# Patient Record
Sex: Female | Born: 1985 | Race: Black or African American | Hispanic: No | Marital: Single | Smoking: Never smoker
Health system: Southern US, Community
[De-identification: ages and names within clinical notes are randomized; demographics above are authoritative.]

## PROBLEM LIST (undated history)

## (undated) DIAGNOSIS — Z789 Other specified health status: Secondary | ICD-10-CM

## (undated) DIAGNOSIS — B009 Herpesviral infection, unspecified: Secondary | ICD-10-CM

## (undated) HISTORY — PX: OVARIAN CYST REMOVAL: SHX89

## (undated) HISTORY — DX: Other specified health status: Z78.9

---

## 2004-07-13 ENCOUNTER — Ambulatory Visit: Payer: Self-pay | Admitting: Family Medicine

## 2004-07-14 ENCOUNTER — Ambulatory Visit (HOSPITAL_COMMUNITY): Admission: RE | Admit: 2004-07-14 | Discharge: 2004-07-14 | Payer: Self-pay | Admitting: *Deleted

## 2004-07-17 ENCOUNTER — Ambulatory Visit (HOSPITAL_COMMUNITY): Admission: RE | Admit: 2004-07-17 | Discharge: 2004-07-17 | Payer: Self-pay | Admitting: *Deleted

## 2004-07-18 ENCOUNTER — Ambulatory Visit: Payer: Self-pay | Admitting: Obstetrics & Gynecology

## 2004-08-07 ENCOUNTER — Inpatient Hospital Stay (HOSPITAL_COMMUNITY): Admission: RE | Admit: 2004-08-07 | Discharge: 2004-08-09 | Payer: Self-pay | Admitting: Obstetrics & Gynecology

## 2004-08-07 ENCOUNTER — Ambulatory Visit: Payer: Self-pay | Admitting: Obstetrics & Gynecology

## 2004-08-14 ENCOUNTER — Inpatient Hospital Stay (HOSPITAL_COMMUNITY): Admission: AD | Admit: 2004-08-14 | Discharge: 2004-08-14 | Payer: Self-pay | Admitting: Obstetrics & Gynecology

## 2004-08-29 ENCOUNTER — Ambulatory Visit: Payer: Self-pay | Admitting: Obstetrics and Gynecology

## 2014-08-31 ENCOUNTER — Ambulatory Visit: Payer: Medicaid Other | Admitting: Certified Nurse Midwife

## 2014-09-09 ENCOUNTER — Emergency Department (HOSPITAL_COMMUNITY)
Admission: EM | Admit: 2014-09-09 | Discharge: 2014-09-09 | Disposition: A | Discharge status: Home / Self Care | Payer: PRIVATE HEALTH INSURANCE | Source: Home / Self Care | Attending: Family Medicine | Admitting: Family Medicine

## 2014-09-09 ENCOUNTER — Encounter (HOSPITAL_COMMUNITY): Payer: Self-pay | Admitting: Emergency Medicine

## 2014-09-09 DIAGNOSIS — S99922A Unspecified injury of left foot, initial encounter: Secondary | ICD-10-CM

## 2014-09-09 MED ORDER — HYDROCODONE-ACETAMINOPHEN 5-325 MG PO TABS: 1.0000 | ORAL_TABLET | Freq: Four times a day (QID) | ORAL | Status: DC | PRN

## 2014-09-09 NOTE — Discharge Instructions (Signed)
Ice, buddy tape toes, firm shoe and pain medicine as needed, see orthopedist if further problems

## 2014-09-09 NOTE — ED Provider Notes (Addendum)
CSN: [id number]     Arrival date & time 09/09/14  1939 History   First MD Initiated Contact with Patient 09/09/14 2011     Chief Complaint  Patient presents with  . Toe Pain   (Consider location/radiation/quality/duration/timing/severity/associated sxs/prior Treatment) Patient is a 29 y.o. female presenting with toe pain. The history is provided by the patient.  Toe Pain This is a new problem. The current episode started 12 to 24 hours ago (hit door jam of bathroom last eve with left 4th toe, swollen , sl deviation, painful.). The problem has not changed since onset.   History reviewed. No pertinent past medical history. Past Surgical History  Procedure Laterality Date  . Ovarian cyst removal     No family history on file. History  Substance Use Topics  . Smoking status: Not on file  . Smokeless tobacco: Not on file  . Alcohol Use: No   OB History    No data available     Review of Systems  Musculoskeletal: Positive for joint swelling and gait problem. Negative for myalgias and back pain.  Skin: Negative.     Allergies  Penicillins  Home Medications   Prior to Admission medications   Medication Sig Start Date End Date Taking? Authorizing Provider  HYDROcodone-acetaminophen (NORCO/VICODIN) 5-325 MG per tablet Take 1 tablet by mouth every 6 (six) hours as needed. 09/09/14   Linna HoffJames D Veronia Laprise, MD   BP 134/90 mmHg  Pulse 67  Temp(Src) 98.5 F (36.9 C) (Oral)  Resp 20  SpO2 100% Physical Exam  Constitutional: She is oriented to person, place, and time. She appears well-developed and well-nourished.  Musculoskeletal: She exhibits tenderness.       Feet:  Neurological: She is alert and oriented to person, place, and time.  Skin: Skin is warm and dry.  Nursing note and vitals reviewed.   ED Course  Procedures (including critical care time) Labs Review Labs Reviewed - No data to display  Imaging Review No results found.   MDM   1. Injury of toe on left foot,  initial encounter        Linna HoffJames D Kemba Hoppes, MD 09/09/14 2046  Linna HoffJames D Abrish Erny, MD 09/09/14 2059

## 2014-09-09 NOTE — ED Notes (Signed)
Left toe pain, stumped toe on door-facing.

## 2014-09-30 ENCOUNTER — Ambulatory Visit: Payer: Medicaid Other | Admitting: Certified Nurse Midwife

## 2014-10-15 ENCOUNTER — Ambulatory Visit (INDEPENDENT_AMBULATORY_CARE_PROVIDER_SITE_OTHER): Payer: Medicaid Other | Admitting: Certified Nurse Midwife

## 2014-10-15 ENCOUNTER — Encounter: Payer: Self-pay | Admitting: Certified Nurse Midwife

## 2014-10-15 VITALS — BP 133/92 | HR 77 | Temp 98.7°F | Ht 72.0 in | Wt 292.0 lb

## 2014-10-15 DIAGNOSIS — E669 Obesity, unspecified: Secondary | ICD-10-CM

## 2014-10-15 DIAGNOSIS — Z113 Encounter for screening for infections with a predominantly sexual mode of transmission: Secondary | ICD-10-CM

## 2014-10-15 DIAGNOSIS — Z8742 Personal history of other diseases of the female genital tract: Secondary | ICD-10-CM | POA: Insufficient documentation

## 2014-10-15 DIAGNOSIS — Z0000 Encounter for general adult medical examination without abnormal findings: Secondary | ICD-10-CM

## 2014-10-15 DIAGNOSIS — Z01419 Encounter for gynecological examination (general) (routine) without abnormal findings: Secondary | ICD-10-CM

## 2014-10-15 DIAGNOSIS — L680 Hirsutism: Secondary | ICD-10-CM

## 2014-10-15 LAB — COMPREHENSIVE METABOLIC PANEL
ALT: 11 U/L (ref 0–35)
AST: 33 U/L (ref 0–37)
Albumin: 3.6 g/dL (ref 3.5–5.2)
Alkaline Phosphatase: 58 U/L (ref 39–117)
BILIRUBIN TOTAL: 0.5 mg/dL (ref 0.2–1.2)
BUN: 9 mg/dL (ref 6–23)
CALCIUM: 8.6 mg/dL (ref 8.4–10.5)
CHLORIDE: 102 meq/L (ref 96–112)
CO2: 28 mEq/L (ref 19–32)
Creat: 0.59 mg/dL (ref 0.50–1.10)
Glucose, Bld: 67 mg/dL — ABNORMAL LOW (ref 70–99)
POTASSIUM: 4.1 meq/L (ref 3.5–5.3)
Sodium: 141 mEq/L (ref 135–145)
TOTAL PROTEIN: 7.4 g/dL (ref 6.0–8.3)

## 2014-10-15 LAB — CBC WITH DIFFERENTIAL/PLATELET
Basophils Absolute: 0 10*3/uL (ref 0.0–0.1)
Basophils Relative: 0 % (ref 0–1)
EOS ABS: 0.1 10*3/uL (ref 0.0–0.7)
EOS PCT: 1 % (ref 0–5)
HCT: 39.3 % (ref 36.0–46.0)
Hemoglobin: 13 g/dL (ref 12.0–15.0)
LYMPHS ABS: 2.6 10*3/uL (ref 0.7–4.0)
LYMPHS PCT: 20 % (ref 12–46)
MCH: 29.1 pg (ref 26.0–34.0)
MCHC: 33.1 g/dL (ref 30.0–36.0)
MCV: 88.1 fL (ref 78.0–100.0)
MONO ABS: 0.5 10*3/uL (ref 0.1–1.0)
MPV: 9.3 fL (ref 8.6–12.4)
Monocytes Relative: 4 % (ref 3–12)
Neutro Abs: 9.8 10*3/uL — ABNORMAL HIGH (ref 1.7–7.7)
Neutrophils Relative %: 75 % (ref 43–77)
Platelets: 410 10*3/uL — ABNORMAL HIGH (ref 150–400)
RBC: 4.46 MIL/uL (ref 3.87–5.11)
RDW: 15 % (ref 11.5–15.5)
WBC: 13.1 10*3/uL — ABNORMAL HIGH (ref 4.0–10.5)

## 2014-10-15 LAB — CHOLESTEROL, TOTAL: CHOLESTEROL: 158 mg/dL (ref 0–200)

## 2014-10-15 LAB — HDL CHOLESTEROL: HDL: 57 mg/dL (ref >=46)

## 2014-10-15 LAB — TRIGLYCERIDES: TRIGLYCERIDES: 47 mg/dL (ref <150)

## 2014-10-15 NOTE — Progress Notes (Signed)
Patient ID: Katelyn LeanCynthia Bender, female   DOB: 02/06/1986, 29 y.o.   MRN: [id number]    Subjective:      Katelyn Bender is a 29 y.o. female here for a routine exam.  Current complaints: none, desires to continue Mirena IUD.    Would like to have one more child.  Going to school for music teacher.    Personal health questionnaire:  Is patient Ashkenazi Jewish, have a family history of breast and/or ovarian cancer: no Is there a family history of uterine cancer diagnosed at age < 9350, gastrointestinal cancer, urinary tract cancer, family member who is a Personnel officerLynch syndrome-associated carrier: no Is the patient overweight and hypertensive, family history of diabetes, personal history of gestational diabetes, preeclampsia or PCOS: yes Is patient over 2355, have PCOS,  family history of premature CHD under age 30, diabetes, smoke, have hypertension or peripheral artery disease:  Yes, mother DM, MGM had HTN.  MGF had DM.   At any time, has a partner hit, kicked or otherwise hurt or frightened you?: no Over the past 2 weeks, have you felt down, depressed or hopeless?: no Over the past 2 weeks, have you felt little interest or pleasure in doing things?:no   Gynecologic History No LMP recorded. Patient is not currently having periods (Reason: IUD). Contraception: IUD, placed 2012,  Needs to be changed January 2017 Last Pap: unknown. Results were: abnormal pap according to the patient and has had leep procedures in the past. Last mammogram: N/A.   Obstetric History OB History  No data available  G2P1, vaginal delivery 2011  History reviewed. No pertinent past medical history.  Past Surgical History  Procedure Laterality Date  . Ovarian cyst removal     2006 Teratoma  7#, no problems since  Current outpatient prescriptions:  .  HYDROcodone-acetaminophen (NORCO/VICODIN) 5-325 MG per tablet, Take 1 tablet by mouth every 6 (six) hours as needed. (Patient not taking: Reported on 10/15/2014), Disp: 10 tablet,  Rfl: 0 Allergies  Allergen Reactions  . Penicillins     History  Substance Use Topics  . Smoking status: Never Smoker   . Smokeless tobacco: Not on file  . Alcohol Use: No    Family History  Problem Relation Age of Onset  . Diabetes Mother       Review of Systems  Constitutional: negative for fatigue and weight loss, + weight gain Respiratory: negative for cough and wheezing Cardiovascular: negative for chest pain, fatigue and palpitations Gastrointestinal: negative for abdominal pain and change in bowel habits Musculoskeletal:negative for myalgias Neurological: negative for gait problems and tremors Behavioral/Psych: negative for abusive relationship, depression Endocrine: negative for temperature intolerance   Genitourinary:negative for abnormal menstrual periods, genital lesions, hot flashes, sexual problems and vaginal discharge Integument/breast: negative for breast lump, breast tenderness, nipple discharge and skin lesion(s)    Objective:       BP 133/92 mmHg  Pulse 77  Temp(Src) 98.7 F (37.1 C)  Ht 6' (1.829 m)  Wt 292 lb (132.45 kg)  BMI 39.59 kg/m2  LMP  General:   alert  Skin:   no rash or abnormalities  Lungs:   clear to auscultation bilaterally  Heart:   regular rate and rhythm, S1, S2 normal, no murmur, click, rub or gallop  Breasts:   normal without suspicious masses, skin or nipple changes or axillary nodes  Abdomen:  normal findings: no organomegaly, soft, non-tender and no hernia obese  Pelvis:  External genitalia: normal general appearance Urinary system: urethral meatus normal  and bladder without fullness, nontender Vaginal: normal without tenderness, induration or masses Cervix: normal appearance Adnexa: normal bimanual exam, difficult to assess d/t body habitus Uterus: anteverted and non-tender, normal size   Lab Review Urine pregnancy test Labs reviewed no Radiologic studies reviewed no  50% of 30 min visit spent on counseling and  coordination of care.   Assessment:    Healthy female exam.   Continue Mirena IUD with replacement in January 2017 Hx of ovarian cyst   Plan:    Education reviewed: depression evaluation, low fat, low cholesterol diet, safe sex/STD prevention, self breast exams, skin cancer screening and weight bearing exercise. Contraception: IUD. Follow up in: 6 months.   No orders of the defined types were placed in this encounter.   Orders Placed This Encounter  Procedures  . SureSwab, Vaginosis/Vaginitis Plus  . US Transvaginal Non-OB    Standing Status: Future     Number of Occurrences:      Standing Expiration Date: 12/16/2015    Order Specific Question:  Reason for Exam (SYMPTOM  OR DIAGNOSIS REQUIRED)    Answer:  hx of ovarian tetratoma    Order Specific Question:  Preferred imaging location?    Answer:  [facility]  . HIV antibody (with reflex)  . Hepatitis B surface antigen  . RPR  . Hepatitis C antibody  . TSH  . Prolactin  . Testosterone, Free, Total, SHBG  . 17-Hydroxyprogesterone  . Progesterone  . CBC with Differential/Platelet  . Comprehensive metabolic panel  . Cholesterol, total  . Triglycerides  . HDL cholesterol  . Hemoglobin A1c  . Referral to Nutrition and Diabetes Services    Referral Priority:  Routine    Referral Type:  Consultation    Referral Reason:  Specialty Services Required    Number of Visits Requested:  1

## 2014-10-16 LAB — RPR

## 2014-10-16 LAB — PROLACTIN: Prolactin: 20.7 ng/mL

## 2014-10-16 LAB — HIV ANTIBODY (ROUTINE TESTING W REFLEX): HIV 1&2 Ab, 4th Generation: NONREACTIVE

## 2014-10-16 LAB — PROGESTERONE: PROGESTERONE: 0.2 ng/mL

## 2014-10-16 LAB — HEMOGLOBIN A1C
Hgb A1c MFr Bld: 5.3 % (ref <5.7)
MEAN PLASMA GLUCOSE: 105 mg/dL (ref <117)

## 2014-10-16 LAB — HEPATITIS B SURFACE ANTIGEN: HEP B S AG: NEGATIVE

## 2014-10-16 LAB — TSH: TSH: 2.94 u[IU]/mL (ref 0.350–4.500)

## 2014-10-16 LAB — HEPATITIS C ANTIBODY: HCV AB: NEGATIVE

## 2014-10-19 LAB — TESTOSTERONE, FREE, TOTAL, SHBG
SEX HORMONE BINDING: 47 nmol/L (ref 17–124)
TESTOSTERONE FREE: 6.2 pg/mL (ref 0.6–6.8)
TESTOSTERONE: 43 ng/dL (ref 10–70)
Testosterone-% Free: 1.4 % (ref 0.4–2.4)

## 2014-10-19 LAB — 17-HYDROXYPROGESTERONE: 17-OH-PROGESTERONE, LC/MS/MS: 15 ng/dL

## 2014-10-20 LAB — PAP IG AND HPV HIGH-RISK: HPV DNA HIGH RISK: DETECTED — AB

## 2014-10-21 ENCOUNTER — Ambulatory Visit (HOSPITAL_COMMUNITY)
Admission: RE | Admit: 2014-10-21 | Discharge: 2014-10-21 | Disposition: A | Discharge status: Home / Self Care | Payer: BLUE CROSS/BLUE SHIELD | Source: Ambulatory Visit | Attending: Certified Nurse Midwife | Admitting: Certified Nurse Midwife

## 2014-10-21 ENCOUNTER — Other Ambulatory Visit: Payer: Self-pay | Admitting: Certified Nurse Midwife

## 2014-10-21 DIAGNOSIS — L680 Hirsutism: Secondary | ICD-10-CM

## 2014-10-21 DIAGNOSIS — Z975 Presence of (intrauterine) contraceptive device: Secondary | ICD-10-CM | POA: Insufficient documentation

## 2014-10-21 DIAGNOSIS — B9689 Other specified bacterial agents as the cause of diseases classified elsewhere: Secondary | ICD-10-CM

## 2014-10-21 DIAGNOSIS — Z8742 Personal history of other diseases of the female genital tract: Secondary | ICD-10-CM

## 2014-10-21 DIAGNOSIS — N8320 Unspecified ovarian cysts: Secondary | ICD-10-CM | POA: Insufficient documentation

## 2014-10-21 DIAGNOSIS — N760 Acute vaginitis: Principal | ICD-10-CM

## 2014-10-21 DIAGNOSIS — N83202 Unspecified ovarian cyst, left side: Secondary | ICD-10-CM

## 2014-10-21 LAB — SURESWAB, VAGINOSIS/VAGINITIS PLUS
Atopobium vaginae: 6.3 Log (cells/mL)
C. GLABRATA, DNA: NOT DETECTED
C. albicans, DNA: NOT DETECTED
C. parapsilosis, DNA: NOT DETECTED
C. trachomatis RNA, TMA: NOT DETECTED
C. tropicalis, DNA: NOT DETECTED
GARDNERELLA VAGINALIS: 7.5 Log (cells/mL)
LACTOBACILLUS SPECIES: NOT DETECTED Log (cells/mL)
MEGASPHAERA SPECIES: 7 Log (cells/mL)
N. GONORRHOEAE RNA, TMA: NOT DETECTED
T. vaginalis RNA, QL TMA: NOT DETECTED

## 2014-10-21 MED ORDER — TINIDAZOLE 500 MG PO TABS: 2.0000 g | ORAL_TABLET | Freq: Every day | ORAL | Status: AC

## 2014-11-09 ENCOUNTER — Encounter: Payer: Self-pay | Admitting: Obstetrics

## 2014-11-09 ENCOUNTER — Ambulatory Visit (INDEPENDENT_AMBULATORY_CARE_PROVIDER_SITE_OTHER): Payer: PRIVATE HEALTH INSURANCE | Admitting: Obstetrics

## 2014-11-09 ENCOUNTER — Other Ambulatory Visit: Payer: Self-pay | Admitting: Obstetrics

## 2014-11-09 VITALS — BP 120/84 | HR 72 | Temp 98.3°F | Ht 72.0 in | Wt 290.0 lb

## 2014-11-09 DIAGNOSIS — IMO0002 Reserved for concepts with insufficient information to code with codable children: Secondary | ICD-10-CM

## 2014-11-09 DIAGNOSIS — R896 Abnormal cytological findings in specimens from other organs, systems and tissues: Secondary | ICD-10-CM | POA: Diagnosis not present

## 2014-11-09 DIAGNOSIS — Z01812 Encounter for preprocedural laboratory examination: Secondary | ICD-10-CM

## 2014-11-09 LAB — POCT URINE PREGNANCY: PREG TEST UR: NEGATIVE

## 2014-11-09 NOTE — Progress Notes (Signed)
Colposcopy Procedure Note  Indications: Pap smear 1 months ago showed: ASCUS with POSITIVE high risk HPV. The prior pap showed ASCUS with POSITIVE high risk HPV.  Prior cervical/vaginal disease: normal exam without visible pathology. Prior cervical treatment: no treatment.  Procedure Details  The risks and benefits of the procedure and Written informed consent obtained.  A time-out was performed confirming the patient, procedure and allergy status  Speculum placed in vagina and excellent visualization of cervix achieved, cervix swabbed x 3 with acetic acid solution.  Findings: Cervix: no visible lesions, no mosaicism, no punctation and no abnormal vasculature; SCJ visualized 360 degrees without lesions, endocervical curettage performed, cervical biopsies taken at 7 and 10 o'clock, specimen labelled and sent to pathology and hemostasis achieved with silver nitrate.   Vaginal inspection: normal without visible lesions. Vulvar colposcopy: vulvar colposcopy not performed.   Physical Exam   Specimens: ECC and cervical biopsies  Complications: none.  Plan: Specimens labelled and sent to Pathology. Will base further treatment on Pathology findings. Treatment options discussed with patient. Post biopsy instructions given to patient. Return to discuss Pathology results in 2 weeks.

## 2014-11-09 NOTE — Patient Instructions (Signed)
Abnormal Pap Test Information During a Pap test, the cells on the surface of your cervix are checked to see if they look normal, abnormal, or if they show signs of having been altered by a certain type of virus called human papillomavirus, or HPV. Cervical cells that have been affected by HPV are called dysplasia. Dysplasia is not cancer, but describes abnormal cells found on the surface of the cervix. Depending on the degree of dysplasia, some of the cells may be considered pre-cancerous and may turn into cancer over time if follow up with a caregiver is delayed.  WHAT DOES AN ABNORMAL PAP TEST MEAN? Having an abnormal pap test does not mean that you have cancer. However, certain types of abnormal pap tests can be a sign that a person is at a higher risk of developing cancer. Your caregiver will want to do other tests to find out more about the abnormal cells. Your abnormal Pap test results could show:   Small and uncertain changes that should be carefully watched.   Cervical dysplasia that has caused mild changes and can be followed over time.  Cervical dysplasia that is more severe and needs to be followed and treated to ensure the problem goes away.  Cancer.  When severe cervical dysplasia is found and treated early, it rarely will grow into cancer.  WHAT WILL BE DONE ABOUT MY ABNORMAL PAP TEST?  A colposcopy may be needed. This is a procedure where your cervix is examined using light and magnification.  A small tissue sample of your cervix (biopsy) may need to be removed and then examined. This is often performed if there are areas that appear infected.  A sample of cells from the cervical canal may be removed with either a small brush or scraping instrument (curette). Based on the results of the procedures above, some caregivers may recommend either cryotherapy of the cervix or a surgical LEEP where a portion of the cervix is removed. LEEP is short for "loop electrical excisional  procedure." Rarely, a caregiver may recommend a cone biopsy.This is a procedure where a small, cone-shaped sample of your cervix is taken out. The part that is taken out is the area where the abnormal cells are.  WHAT IF I HAVE A DYSPLASIA OR A CANCER? You may be referred to a specialist. Radiation may also be a treatment for more advanced cancer. Having a hysterectomy is the last treatment option for dysplasia, but it is a more common treatment for someone with cancer. All treatment options will be discussed with you by your caregiver. WHAT SHOULD YOU DO AFTER BEING TREATED? If you have had an abnormal pap test, you should continue to have regular pap tests and check-ups as directed by your caregiver. Your cervical problem will be carefully watched so it does not get worse. Also, your caregiver can watch for, and treat, any new problems that may come up. Document Released: 07/18/2010 Document Revised: 07/28/2012 Document Reviewed: 03/29/2011 ExitCare Patient Information 2015 [employer]. This information is not intended to replace advice given to you by your health care provider. Make sure you discuss any questions you have with your health care provider.  

## 2014-11-09 NOTE — Addendum Note (Signed)
Addended by: Marya Landry D on: 11/09/2014 01:34 PM   Modules accepted: Orders

## 2014-11-11 ENCOUNTER — Ambulatory Visit: Payer: Medicaid Other | Admitting: Dietician

## 2014-11-11 LAB — SURESWAB, VAGINOSIS/VAGINITIS PLUS
ATOPOBIUM VAGINAE: NOT DETECTED Log (cells/mL)
C. PARAPSILOSIS, DNA: NOT DETECTED
C. TROPICALIS, DNA: NOT DETECTED
C. albicans, DNA: NOT DETECTED
C. glabrata, DNA: NOT DETECTED
C. trachomatis RNA, TMA: NOT DETECTED
Gardnerella vaginalis: 6.9 Log (cells/mL)
LACTOBACILLUS SPECIES: NOT DETECTED Log (cells/mL)
MEGASPHAERA SPECIES: 6.1 Log (cells/mL)
N. GONORRHOEAE RNA, TMA: NOT DETECTED
T. vaginalis RNA, QL TMA: NOT DETECTED

## 2014-11-12 ENCOUNTER — Other Ambulatory Visit: Payer: Self-pay | Admitting: Obstetrics

## 2014-11-12 DIAGNOSIS — B9689 Other specified bacterial agents as the cause of diseases classified elsewhere: Secondary | ICD-10-CM

## 2014-11-12 DIAGNOSIS — N760 Acute vaginitis: Principal | ICD-10-CM

## 2014-11-12 MED ORDER — TINIDAZOLE 500 MG PO TABS: 1000.0000 mg | ORAL_TABLET | Freq: Every day | ORAL | Status: DC

## 2014-11-23 ENCOUNTER — Encounter: Payer: Self-pay | Admitting: Obstetrics

## 2014-11-23 ENCOUNTER — Ambulatory Visit (INDEPENDENT_AMBULATORY_CARE_PROVIDER_SITE_OTHER): Payer: Medicaid Other | Admitting: Obstetrics

## 2014-11-23 VITALS — BP 121/85 | HR 75 | Temp 98.6°F | Ht 72.0 in | Wt 289.0 lb

## 2014-11-23 DIAGNOSIS — R896 Abnormal cytological findings in specimens from other organs, systems and tissues: Secondary | ICD-10-CM

## 2014-11-23 DIAGNOSIS — A499 Bacterial infection, unspecified: Secondary | ICD-10-CM | POA: Diagnosis not present

## 2014-11-23 DIAGNOSIS — N760 Acute vaginitis: Secondary | ICD-10-CM | POA: Diagnosis not present

## 2014-11-23 DIAGNOSIS — B9689 Other specified bacterial agents as the cause of diseases classified elsewhere: Secondary | ICD-10-CM

## 2014-11-23 DIAGNOSIS — IMO0002 Reserved for concepts with insufficient information to code with codable children: Secondary | ICD-10-CM

## 2014-11-23 NOTE — Progress Notes (Signed)
Patient ID: Katelyn Ghattasfemale   DOB: July 26, 1985, 28 y.o.   MRN: [id number]  Chief Complaint  Patient presents with  . Follow-up    HPI Katelyn Bender is a 29 y.o. female.  Presents for consultation for abnormal pap smear and results of colposcopic directed biopsies.  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 HPI  History reviewed. No pertinent past medical history.  Past Surgical History  Procedure Laterality Date  . Ovarian cyst removal      Family History  Problem Relation Age of Onset  . Diabetes Mother     Social History History  Substance Use Topics  . Smoking status: Never Smoker   . Smokeless tobacco: Not on file  . Alcohol Use: No    Allergies  Allergen Reactions  . Penicillins     Current Outpatient Prescriptions  Medication Sig Dispense Refill  . tinidazole (TINDAMAX) 500 MG tablet Take 2 tablets (1,000 mg total) by mouth daily with breakfast. (Patient not taking: Reported on 11/23/2014) 10 tablet 2   No current facility-administered medications for this visit.    Review of Systems Review of Systems Constitutional: negative for fatigue and weight loss Respiratory: negative for cough and wheezing Cardiovascular: negative for chest pain, fatigue and palpitations Gastrointestinal: negative for abdominal pain and change in bowel habits Genitourinary:negative Integument/breast: negative for nipple discharge Musculoskeletal:negative for myalgias Neurological: negative for gait problems and tremors Behavioral/Psych: negative for abusive relationship, depression Endocrine: negative for temperature intolerance     Blood pressure 121/85, pulse 75, temperature 98.6 F (37 C), height 6' (1.829 m), weight 289 lb (131.09 kg).  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   No orders of the defined types were placed in this encounter.

## 2014-12-03 ENCOUNTER — Encounter: Payer: Self-pay | Admitting: *Deleted

## 2014-12-13 ENCOUNTER — Ambulatory Visit: Payer: Medicaid Other | Admitting: Dietician

## 2015-01-17 ENCOUNTER — Encounter: Payer: Medicaid Other | Attending: Certified Nurse Midwife | Admitting: *Deleted

## 2015-01-17 ENCOUNTER — Encounter: Payer: Self-pay | Admitting: *Deleted

## 2015-01-17 DIAGNOSIS — Z713 Dietary counseling and surveillance: Secondary | ICD-10-CM | POA: Insufficient documentation

## 2015-01-17 DIAGNOSIS — E669 Obesity, unspecified: Secondary | ICD-10-CM | POA: Insufficient documentation

## 2015-01-17 DIAGNOSIS — Z6839 Body mass index (BMI) 39.0-39.9, adult: Secondary | ICD-10-CM | POA: Insufficient documentation

## 2015-01-17 NOTE — Progress Notes (Signed)
  Medical Nutrition Therapy:  Appt start time: 0800 end time:  0900.  Assessment:  Primary concerns today: Katelyn Bender is here upon referral for obesity.  She states she has tried to lose weight in the past, but her modification have not been sustainable.  She is full tim in school and she thinks that has affected her lifestyle habits.  Her mom has diabetes and Katelyn Bender is trying to avoid that.  She presents with hirsutism.  She has history of ovarian cyst.  Denies irregular periods , but has IUD placed.  Lab work in July 2016 was normal, but again, IUD was in place.  PCOS could be possibility, but not able to determine with IUD. Attempted weight loss: detox tea, Herbalife, calorie restriction and none of those were helpful.   She states she has low energy and is tired all the time.  Does to sleep around 11 and wakes up around 6. She wakes up tired and will take a nap in the mornings after her son goes to school  States she is the heaviest of all her siblings.  She started gaining weight excessively around menses (11 years).   Heaviest weight outside of pregnancy is current: 293 lb Lowest weight: 245 lb Most consistent weight: 280 lb Would like to weigh: 200 lb  Katelyn Bender does grocery shopping for her household and the cooking.  She likes to fry foods and eats out often.  When at home she eats in the living room and her bedroom. She watches tv while eating and is not a fast eater.    Preferred Learning Style:   No preference indicated   Learning Readiness:  Ready  MEDICATIONS: none   DIETARY INTAKE:  Usual eating pattern includes 1-2 meals and 2-3 snacks per day.  Everyday foods include energy-dense processed/convenience foods.  Avoided foods include none.    24-hr recall:  B ( AM):  Scrambled eggs, bacon, 2 slices toast  Snk ( AM): sugary cereal  Late L ( PM): McDonald's double cheeseburger and fries  Snk ( PM): cookie D ( PM): none Snk ( PM): none Beverages: Hi-C, OJ  Usual physical  activity: ADLs  Estimated energy needs: 2000 calories   Nutritional Diagnosis:  NB-1.7 Undesireable food choices As related to excessive convenience food consumption.  As evidenced by dietary recall.    Intervention:  Nutrition counseling provided.  Discussed metabolic effects of meal skipping and discouraged this practice.  Recommended 3 meals/day.  Discussed MyPlate recommendations for meal planning, focusing on whole grains,lean proteins, calcium-rich dairy (lactose-free options discussed), and increased fruits/vegetables.  Discussed healthier options when eating out and making healthier beverage choices.  Encouraged Katelyn Bender to get small, attainable goals, and then move forward as she meets those goals  Teaching Method Utilized:  Visual Auditory   Handouts given during visit include:  MyPlate  Healthier fast food options  Breakfast ideas  Bake, broil, grill  Barriers to learning/adherence to lifestyle change: planning ahead with meals  Demonstrated degree of understanding via:  Teach Back   Monitoring/Evaluation:  Dietary intake, exercise,  and body weight in 6 week(s).

## 2015-01-17 NOTE — Patient Instructions (Signed)
Aim for 3 meals each day: follow MyPlate recommendations Aim to drink more water and less sugary drinks Choose healthier options when eating out: chicken vs beef, grilled vs fried.  Try not to supersize

## 2015-02-28 ENCOUNTER — Encounter: Payer: Self-pay | Admitting: *Deleted

## 2015-02-28 ENCOUNTER — Encounter: Payer: Medicaid Other | Attending: Certified Nurse Midwife | Admitting: *Deleted

## 2015-02-28 DIAGNOSIS — Z6839 Body mass index (BMI) 39.0-39.9, adult: Secondary | ICD-10-CM | POA: Diagnosis not present

## 2015-02-28 DIAGNOSIS — Z713 Dietary counseling and surveillance: Secondary | ICD-10-CM | POA: Insufficient documentation

## 2015-02-28 DIAGNOSIS — E669 Obesity, unspecified: Secondary | ICD-10-CM

## 2015-02-28 NOTE — Progress Notes (Signed)
  Medical Nutrition Therapy:  Appt start time: 0800 end time:  0830.  Assessment:  Primary concerns today: Katelyn Bender is here for follow up nutrition cou States she has been trying to eat 3 meals/day.  She gets something easy for breakfast in the morning like PB and J or egg white sandwich from [employer].  Also started physical activity.  She states she has been losing weight and she is really pleased.  States her energy is a little better.  She stays busy and that wears her out.     Preferred Learning Style:   No preference indicated   Learning Readiness:  Change in progress  MEDICATIONS: none   DIETARY INTAKE:  Usual eating pattern includes 3 meals and 0-1 snacks per day.  Everyday foods include energy-dense processed/convenience foods.  Avoided foods include none.    24-hr recall:  B: none L: buffet (but only had 1 plate): BBQ, coleslaw, fried chicken breast, shrimp,creamed potatoes, some [place] Beverages: sweet tea and water   Typical day B: PB and J or [employer] sandwich with water or OJ L: mcdonald's (happy meal with apple slices, apple juice, little fries) D: tacos with salad or another happy meal Beverages: water throughout the day   Usual physical activity: treadmill 25 minutes and some weight machines 2 days/week.  Estimated energy needs: 2000 calories   Nutritional Diagnosis:  NB-1.7 Undesireable food choices As related to excessive convenience food consumption.  As evidenced by dietary recall.    Intervention:  Nutrition counseling provided.  Praised Katelyn Bender for her great progress.  Reiterated setting small, attainable goals, and then setting news goals once those are reached.  Her goal this time is to exercise 3 days/week and to increase vegetables by eating more salads when she eats out   Teaching Method Utilized:  Auditory    Barriers to learning/adherence to lifestyle change: planning ahead with meals  Demonstrated degree of understanding via:   Teach Back   Monitoring/Evaluation:  Dietary intake, exercise,  and body weight prn.

## 2015-05-23 ENCOUNTER — Encounter: Payer: Self-pay | Admitting: Obstetrics

## 2015-05-23 ENCOUNTER — Other Ambulatory Visit: Payer: Self-pay | Admitting: Obstetrics

## 2015-05-23 ENCOUNTER — Ambulatory Visit (INDEPENDENT_AMBULATORY_CARE_PROVIDER_SITE_OTHER): Payer: Medicaid Other | Admitting: Obstetrics

## 2015-05-23 VITALS — BP 120/84 | HR 72 | Temp 98.3°F

## 2015-05-23 DIAGNOSIS — R896 Abnormal cytological findings in specimens from other organs, systems and tissues: Secondary | ICD-10-CM

## 2015-05-23 DIAGNOSIS — Z8742 Personal history of other diseases of the female genital tract: Secondary | ICD-10-CM

## 2015-05-23 DIAGNOSIS — IMO0002 Reserved for concepts with insufficient information to code with codable children: Secondary | ICD-10-CM

## 2015-05-23 NOTE — Progress Notes (Signed)
[name], female   DOB: 1985-12-19, 30 y.o.   MRN: [id number]  Chief Complaint  Patient presents with  . Follow-up    Repeat pap smear    HPI [name] is a 30 y.o. female.  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  Presents for repeat pap.  HPI  History reviewed. No pertinent past medical history.  Past Surgical History  Procedure Laterality Date  . Ovarian cyst removal      Family History  Problem Relation Age of Onset  . Diabetes Mother     Social History Social History  Substance Use Topics  . Smoking status: Never Smoker   . Smokeless tobacco: None  . Alcohol Use: No    Allergies  Allergen Reactions  . Penicillins     No current outpatient prescriptions on file.   No current facility-administered medications for this visit.    Review of Systems Review of Systems Constitutional: negative for fatigue and weight loss Respiratory: negative for cough and wheezing Cardiovascular: negative for chest pain, fatigue and palpitations Gastrointestinal: negative for abdominal pain and change in bowel habits Genitourinary:negative Integument/breast: negative for nipple discharge Musculoskeletal:negative for myalgias Neurological: negative for gait problems and tremors Behavioral/Psych: negative for abusive relationship, depression Endocrine: negative for temperature intolerance     Blood pressure 120/84, pulse 72, temperature 98.3 F (36.8 C).  Physical Exam Physical Exam           Alert and no distress Abdomen:  normal findings: no organomegaly, soft, non-tender and no hernia  Pelvis:  External genitalia: normal general appearance Urinary system: urethral meatus normal and bladder without fullness, nontender Vaginal: normal without tenderness, induration or masses Cervix: normal appearance Adnexa: normal bimanual exam Uterus: anteverted and non-tender, normal size      Data  Reviewed Pap Colposcopic Biopsies  Assessment     LGSIL     Plan    Repeat pap 1 year  Orders Placed This Encounter  Procedures  . SureSwab, Vaginosis/Vaginitis Plus   No orders of the defined types were placed in this encounter.

## 2015-05-23 NOTE — Patient Instructions (Signed)

## 2015-05-25 LAB — PAP IG AND HPV HIGH-RISK: HPV DNA HIGH RISK: DETECTED — AB

## 2015-05-26 ENCOUNTER — Other Ambulatory Visit: Payer: Self-pay | Admitting: Obstetrics

## 2015-05-26 DIAGNOSIS — B9689 Other specified bacterial agents as the cause of diseases classified elsewhere: Secondary | ICD-10-CM

## 2015-05-26 DIAGNOSIS — A5901 Trichomonal vulvovaginitis: Secondary | ICD-10-CM

## 2015-05-26 DIAGNOSIS — N760 Acute vaginitis: Principal | ICD-10-CM

## 2015-05-26 DIAGNOSIS — B3731 Acute candidiasis of vulva and vagina: Secondary | ICD-10-CM

## 2015-05-26 DIAGNOSIS — B373 Candidiasis of vulva and vagina: Secondary | ICD-10-CM

## 2015-05-26 LAB — SURESWAB, VAGINOSIS/VAGINITIS PLUS
Atopobium vaginae: 7 Log (cells/mL)
C. albicans, DNA: DETECTED — AB
C. glabrata, DNA: NOT DETECTED
C. parapsilosis, DNA: NOT DETECTED
C. trachomatis RNA, TMA: NOT DETECTED
C. tropicalis, DNA: NOT DETECTED
GARDNERELLA VAGINALIS: 7.3 Log (cells/mL)
LACTOBACILLUS SPECIES: NOT DETECTED Log (cells/mL)
N. GONORRHOEAE RNA, TMA: NOT DETECTED
T. vaginalis RNA, QL TMA: DETECTED — AB

## 2015-05-26 LAB — HPV TYPE 16/18
HPV GENOTYPE, 16: NOT DETECTED
HPV GENOTYPE, 18: NOT DETECTED

## 2015-05-26 MED ORDER — FLUCONAZOLE 150 MG PO TABS: 150.0000 mg | ORAL_TABLET | Freq: Once | ORAL | Status: DC

## 2015-05-26 MED ORDER — TINIDAZOLE 500 MG PO TABS: 2.0000 g | ORAL_TABLET | Freq: Every day | ORAL | Status: DC

## 2015-06-10 ENCOUNTER — Telehealth: Payer: Self-pay | Admitting: *Deleted

## 2015-06-10 NOTE — Telephone Encounter (Signed)
Patient is interested in an IUD removal.  Attempted to contact the patient and left message for patient to call the office.  

## 2015-06-15 ENCOUNTER — Ambulatory Visit: Payer: Self-pay | Admitting: Obstetrics

## 2015-06-21 ENCOUNTER — Encounter: Payer: Self-pay | Admitting: Obstetrics

## 2015-06-21 ENCOUNTER — Ambulatory Visit (INDEPENDENT_AMBULATORY_CARE_PROVIDER_SITE_OTHER): Payer: Medicaid Other | Admitting: Obstetrics

## 2015-06-21 VITALS — BP 116/83 | HR 83 | Temp 98.2°F | Wt 286.0 lb

## 2015-06-21 DIAGNOSIS — Z3009 Encounter for other general counseling and advice on contraception: Secondary | ICD-10-CM

## 2015-06-21 DIAGNOSIS — Z30432 Encounter for removal of intrauterine contraceptive device: Secondary | ICD-10-CM | POA: Diagnosis not present

## 2015-06-21 DIAGNOSIS — Z30011 Encounter for initial prescription of contraceptive pills: Secondary | ICD-10-CM

## 2015-06-21 MED ORDER — NORETHIN ACE-ETH ESTRAD-FE 1-20 MG-MCG(24) PO TABS: 1.0000 | ORAL_TABLET | Freq: Every day | ORAL | Status: DC

## 2015-06-21 NOTE — Progress Notes (Signed)
Subjective:    Katelyn Bender is a [age] y.o. female who presents for removal of IUD because of expiration. The patient has no complaints today. The patient is sexually active. Pertinent past medical history: none.  The information documented in the HPI was reviewed and verified.  Menstrual History: OB History    No data available      No LMP recorded. Patient is not currently having periods (Reason: IUD).   Patient Active Problem List   Diagnosis Date Noted  . Hx of ovarian cyst 10/15/2014   History reviewed. No pertinent past medical history.  Past Surgical History  Procedure Laterality Date  . Ovarian cyst removal       Current outpatient prescriptions:  .  Norethindrone Acetate-Ethinyl Estrad-FE (LOESTRIN 24 FE) 1-20 MG-MCG(24) tablet, Take 1 tablet by mouth daily., Disp: 1 Package, Rfl: 11 Allergies  Allergen Reactions  . Penicillins     Social History  Substance Use Topics  . Smoking status: Never Smoker   . Smokeless tobacco: Never Used  . Alcohol Use: No    Family History  Problem Relation Age of Onset  . Diabetes Mother        Review of Systems Constitutional: negative for weight loss Genitourinary:negative for abnormal menstrual periods and vaginal discharge   Objective:   BP 116/83 mmHg  Pulse 83  Temp(Src) 98.2 F (36.8 C)  Wt 286 lb (129.729 kg)           General:  Alert and no distress Abdomen:  normal findings: no organomegaly, soft, non-tender and no hernia  Pelvis:  External genitalia: normal general appearance Urinary system: urethral meatus normal and bladder without fullness, nontender Vaginal: normal without tenderness, induration or masses Cervix: normal appearance Adnexa: normal bimanual exam Uterus: anteverted and non-tender, normal size   Lab Review Urine pregnancy test Labs reviewed yes Radiologic studies reviewed no    Assessment:    30 y.o., discontinuing IUD, no contraindications.  Removal with expiration.  Wants to  switch to OCP's  Plan:    Loestrin 24 Fe Rx  All questions answered. Chlamydia specimen. Contraception: OCP (estrogen/progesterone). Discussed healthy lifestyle modifications. Agricultural engineerducational material distributed. GC specimen. KOH prep. Wet prep.    Meds ordered this encounter  Medications  . Norethindrone Acetate-Ethinyl Estrad-FE (LOESTRIN 24 FE) 1-20 MG-MCG(24) tablet    Sig: Take 1 tablet by mouth daily.    Dispense:  1 Package    Refill:  11   Orders Placed This Encounter  Procedures  . SureSwab, Vaginosis/Vaginitis Plus

## 2015-06-24 LAB — SURESWAB, VAGINOSIS/VAGINITIS PLUS
Atopobium vaginae: 6.7 Log (cells/mL)
C. PARAPSILOSIS, DNA: NOT DETECTED
C. TROPICALIS, DNA: NOT DETECTED
C. albicans, DNA: NOT DETECTED
C. glabrata, DNA: NOT DETECTED
C. trachomatis RNA, TMA: NOT DETECTED
GARDNERELLA VAGINALIS: 7.9 Log (cells/mL)
LACTOBACILLUS SPECIES: NOT DETECTED Log (cells/mL)
MEGASPHAERA SPECIES: 7.9 Log (cells/mL)
N. GONORRHOEAE RNA, TMA: NOT DETECTED
T. VAGINALIS RNA, QL TMA: NOT DETECTED

## 2015-06-25 ENCOUNTER — Other Ambulatory Visit: Payer: Self-pay | Admitting: Obstetrics

## 2015-06-25 DIAGNOSIS — B9689 Other specified bacterial agents as the cause of diseases classified elsewhere: Secondary | ICD-10-CM

## 2015-06-25 DIAGNOSIS — N760 Acute vaginitis: Principal | ICD-10-CM

## 2015-06-25 MED ORDER — METRONIDAZOLE 500 MG PO TABS: 500.0000 mg | ORAL_TABLET | Freq: Two times a day (BID) | ORAL | Status: DC

## 2015-07-08 NOTE — Telephone Encounter (Signed)
Patient has had IUD removed since phone call note.

## 2016-07-11 ENCOUNTER — Encounter: Payer: Self-pay | Admitting: *Deleted

## 2016-07-11 ENCOUNTER — Ambulatory Visit (INDEPENDENT_AMBULATORY_CARE_PROVIDER_SITE_OTHER): Payer: Medicaid Other | Admitting: Obstetrics

## 2016-07-11 ENCOUNTER — Other Ambulatory Visit (HOSPITAL_COMMUNITY)
Admission: RE | Admit: 2016-07-11 | Discharge: 2016-07-11 | Disposition: A | Discharge status: Home / Self Care | Payer: Medicaid Other | Source: Ambulatory Visit | Attending: Obstetrics | Admitting: Obstetrics

## 2016-07-11 ENCOUNTER — Encounter: Payer: Self-pay | Admitting: Obstetrics

## 2016-07-11 VITALS — BP 120/76 | HR 76 | Ht 72.0 in | Wt 263.0 lb

## 2016-07-11 DIAGNOSIS — Z113 Encounter for screening for infections with a predominantly sexual mode of transmission: Secondary | ICD-10-CM | POA: Diagnosis not present

## 2016-07-11 DIAGNOSIS — Z01419 Encounter for gynecological examination (general) (routine) without abnormal findings: Secondary | ICD-10-CM | POA: Diagnosis not present

## 2016-07-11 DIAGNOSIS — N898 Other specified noninflammatory disorders of vagina: Secondary | ICD-10-CM

## 2016-07-11 NOTE — Progress Notes (Signed)
Pt presents for Annual, pap, STD testing. Pt has no complaints. Does not want BC at this time.

## 2016-07-11 NOTE — Progress Notes (Signed)
Subjective:        Katelyn Bender is a 31 y.o. female here for a routine exam.  Current complaints: none.    Personal health questionnaire:  Is patient Katelyn Bender, have a family history of breast and/or ovarian cancer: no Is there a family history of uterine cancer diagnosed at age < 6050, gastrointestinal cancer, urinary tract cancer, family member who is a Personnel officerLynch syndrome-associated carrier: no Is the patient overweight and hypertensive, family history of diabetes, personal history of gestational diabetes, preeclampsia or PCOS: no Is patient over 2955, have PCOS,  family history of premature CHD under age 32, diabetes, smoke, have hypertension or peripheral artery disease:  no At any time, has a partner hit, kicked or otherwise hurt or frightened you?: no Over the past 2 weeks, have you felt down, depressed or hopeless?: no Over the past 2 weeks, have you felt little interest or pleasure in doing things?:no   Gynecologic History No LMP recorded. Patient is not currently having periods (Reason: IUD). Contraception: abstinence Last Pap: 2017. Results were: ASCUS with positive High Risk HPV Last mammogram: n/a. Results were: n/a  Obstetric History OB History  Gravida Para Term Preterm AB Living  2       1 1   SAB TAB Ectopic Multiple Live Births    1          # Outcome Date GA Lbr Len/2nd Weight Sex Delivery Anes PTL Lv  2 Gravida 06/05/09    M Vag-Spont None    1 TAB               History reviewed. No pertinent past medical history.  Past Surgical History:  Procedure Laterality Date  . OVARIAN CYST REMOVAL       Current Outpatient Prescriptions:  .  metroNIDAZOLE (FLAGYL) 500 MG tablet, Take 1 tablet (500 mg total) by mouth 2 (two) times daily., Disp: 14 tablet, Rfl: 2 .  Norethindrone Acetate-Ethinyl Estrad-FE (LOESTRIN 24 FE) 1-20 MG-MCG(24) tablet, Take 1 tablet by mouth daily. (Patient not taking: Reported on 07/11/2016), Disp: 1 Package, Rfl: 11 Allergies   Allergen Reactions  . Penicillins     Social History  Substance Use Topics  . Smoking status: Never Smoker  . Smokeless tobacco: Never Used  . Alcohol use No    Family History  Problem Relation Age of Onset  . Diabetes Mother       Review of Systems  Constitutional: negative for fatigue and weight loss Respiratory: negative for cough and wheezing Cardiovascular: negative for chest pain, fatigue and palpitations Gastrointestinal: negative for abdominal pain and change in bowel habits Musculoskeletal:negative for myalgias Neurological: negative for gait problems and tremors Behavioral/Psych: negative for abusive relationship, depression Endocrine: negative for temperature intolerance    Genitourinary:negative for abnormal menstrual periods, genital lesions, hot flashes, sexual problems and vaginal discharge Integument/breast: negative for breast lump, breast tenderness, nipple discharge and skin lesion(s)    Objective:       BP 120/76   Pulse 76   Ht 6' (1.829 m)   Wt 263 lb (119.3 kg)   BMI 35.67 kg/m  General:   alert  Skin:   no rash or abnormalities  Lungs:   clear to auscultation bilaterally  Heart:   regular rate and rhythm, S1, S2 normal, no murmur, click, rub or gallop  Breasts:   normal without suspicious masses, skin or nipple changes or axillary nodes  Abdomen:  normal findings: no organomegaly, soft, non-tender and no hernia  Pelvis:  External genitalia: normal general appearance Urinary system: urethral meatus normal and bladder without fullness, nontender Vaginal: normal without tenderness, induration or masses Cervix: normal appearance Adnexa: normal bimanual exam Uterus: anteverted and non-tender, normal size   Lab Review Urine pregnancy test Labs reviewed yes Radiologic studies reviewed no  50% of 20 min visit spent on counseling and coordination of care.    Assessment:    Healthy female exam.   Contraceptive Counseling and Advice.  Does  not want contraception at this time.  Not sexually active.    Plan:    Education reviewed: calcium supplements, depression evaluation, low fat, low cholesterol diet, safe sex/STD prevention, self breast exams and weight bearing exercise. Contraception: abstinence. Follow up in: 10 year. ( 1 year )   Orders Placed This Encounter  Procedures  . Hepatitis C antibody  . Hepatitis B surface antigen  . HIV antibody  . RPR      [name], female   DOB: 1985/06/24, 31 y.o.   MRN: [id number]

## 2016-07-12 LAB — HIV ANTIBODY (ROUTINE TESTING W REFLEX): HIV SCREEN 4TH GENERATION: NONREACTIVE

## 2016-07-12 LAB — RPR: RPR Ser Ql: NONREACTIVE

## 2016-07-12 LAB — CERVICOVAGINAL ANCILLARY ONLY
Bacterial vaginitis: NEGATIVE
CHLAMYDIA, DNA PROBE: NEGATIVE
Candida vaginitis: POSITIVE — AB
NEISSERIA GONORRHEA: NEGATIVE
TRICH (WINDOWPATH): NEGATIVE

## 2016-07-12 LAB — HEPATITIS C ANTIBODY

## 2016-07-12 LAB — HEPATITIS B SURFACE ANTIGEN: HEP B S AG: NEGATIVE

## 2016-07-13 ENCOUNTER — Other Ambulatory Visit: Payer: Self-pay | Admitting: Obstetrics

## 2016-07-13 DIAGNOSIS — B3731 Acute candidiasis of vulva and vagina: Secondary | ICD-10-CM

## 2016-07-13 DIAGNOSIS — B373 Candidiasis of vulva and vagina: Secondary | ICD-10-CM

## 2016-07-13 MED ORDER — FLUCONAZOLE 150 MG PO TABS: 150.0000 mg | ORAL_TABLET | Freq: Once | ORAL | 0 refills | Status: AC

## 2016-07-16 LAB — CYTOLOGY - PAP
DIAGNOSIS: NEGATIVE
HPV: NOT DETECTED

## 2016-11-22 ENCOUNTER — Telehealth: Payer: Self-pay | Admitting: *Deleted

## 2016-11-22 NOTE — Telephone Encounter (Signed)
Pt called to office with concerns with her bleeding.  Return call to pt. Pt states that her cycle started earlier than normal last month, July 16, and she has had some form of bleeding since. Pt states her bleeding is starting to taper off now but was concerned about length of time bleeding. Pt states that this is the first occurrence of change in cycle. Pt is not currently on birth control. Pt has had new sexual partner.  Pt denies any cramping/pain.  Pt made aware message to be sent to provider for recommendations.    Please advise.

## 2017-04-24 ENCOUNTER — Ambulatory Visit (INDEPENDENT_AMBULATORY_CARE_PROVIDER_SITE_OTHER): Payer: Medicaid Other

## 2017-04-24 DIAGNOSIS — Z3201 Encounter for pregnancy test, result positive: Secondary | ICD-10-CM

## 2017-04-24 DIAGNOSIS — N912 Amenorrhea, unspecified: Secondary | ICD-10-CM

## 2017-04-24 LAB — POCT URINE PREGNANCY: PREG TEST UR: POSITIVE — AB

## 2017-04-25 LAB — BETA HCG QUANT (REF LAB): HCG QUANT: 33783 m[IU]/mL

## 2017-04-26 ENCOUNTER — Telehealth: Payer: Self-pay | Admitting: General Practice

## 2017-04-26 DIAGNOSIS — O3680X0 Pregnancy with inconclusive fetal viability, not applicable or unspecified: Secondary | ICD-10-CM

## 2017-04-26 NOTE — Telephone Encounter (Signed)
[facility] and left message on nurse line stating patient came in for ultrasound that day and nothing was visualized. States patient's LMP is 02/24/17 making her 8 and a half weeks. They are requesting a follow up ultrasound for the patient. She is not having bleeding or pain. Scheduled for 1/22 @ 9am.   Called patient, no answer- left message stating we are trying to reach you regarding an appointment that has been set up for you, please call us back. Will send letter

## 2017-05-07 ENCOUNTER — Ambulatory Visit: Payer: Medicaid Other | Admitting: *Deleted

## 2017-05-07 ENCOUNTER — Ambulatory Visit (HOSPITAL_COMMUNITY)
Admission: RE | Admit: 2017-05-07 | Discharge: 2017-05-07 | Disposition: A | Discharge status: Home / Self Care | Payer: Medicaid Other | Source: Ambulatory Visit | Attending: Obstetrics & Gynecology | Admitting: Obstetrics & Gynecology

## 2017-05-07 DIAGNOSIS — O3680X0 Pregnancy with inconclusive fetal viability, not applicable or unspecified: Secondary | ICD-10-CM

## 2017-05-07 DIAGNOSIS — Z3490 Encounter for supervision of normal pregnancy, unspecified, unspecified trimester: Secondary | ICD-10-CM | POA: Diagnosis present

## 2017-05-07 DIAGNOSIS — Z3A09 9 weeks gestation of pregnancy: Secondary | ICD-10-CM | POA: Insufficient documentation

## 2017-05-07 DIAGNOSIS — Z3491 Encounter for supervision of normal pregnancy, unspecified, first trimester: Secondary | ICD-10-CM | POA: Diagnosis not present

## 2017-05-07 NOTE — Progress Notes (Signed)
Results of ultrasound and chart reviewed for nurse visit. Agree with plan of care.   Rolm Bookbindereill, Caroline M, [place] 05/07/2017 11:04 AM

## 2017-05-07 NOTE — Progress Notes (Signed)
Reviewed results with Antony Odeaaroline Neil, CNM and then informed Apryll US shows live baby with  EDD 12/05/17 and she should start prenatal care. She states she has an appointment next week.

## 2017-05-13 DIAGNOSIS — Z98891 History of uterine scar from previous surgery: Secondary | ICD-10-CM | POA: Insufficient documentation

## 2017-05-14 ENCOUNTER — Encounter: Payer: Medicaid Other | Admitting: Obstetrics

## 2017-05-22 ENCOUNTER — Ambulatory Visit (INDEPENDENT_AMBULATORY_CARE_PROVIDER_SITE_OTHER): Payer: Medicaid Other | Admitting: Obstetrics

## 2017-05-22 ENCOUNTER — Encounter: Payer: Self-pay | Admitting: Obstetrics

## 2017-05-22 ENCOUNTER — Other Ambulatory Visit (HOSPITAL_COMMUNITY)
Admission: RE | Admit: 2017-05-22 | Discharge: 2017-05-22 | Disposition: A | Discharge status: Home / Self Care | Payer: Medicaid Other | Source: Ambulatory Visit | Attending: Obstetrics | Admitting: Obstetrics

## 2017-05-22 VITALS — BP 114/76 | HR 84 | Wt 290.1 lb

## 2017-05-22 DIAGNOSIS — Z3481 Encounter for supervision of other normal pregnancy, first trimester: Secondary | ICD-10-CM | POA: Diagnosis not present

## 2017-05-22 DIAGNOSIS — Z8659 Personal history of other mental and behavioral disorders: Secondary | ICD-10-CM

## 2017-05-22 DIAGNOSIS — Z3480 Encounter for supervision of other normal pregnancy, unspecified trimester: Secondary | ICD-10-CM

## 2017-05-22 DIAGNOSIS — Z8619 Personal history of other infectious and parasitic diseases: Secondary | ICD-10-CM

## 2017-05-22 MED ORDER — VALACYCLOVIR HCL 1 G PO TABS: 1000.0000 mg | ORAL_TABLET | Freq: Two times a day (BID) | ORAL | 6 refills | Status: DC

## 2017-05-22 MED ORDER — VITAFOL ULTRA 29-0.6-0.4-200 MG PO CAPS: 1.0000 | ORAL_CAPSULE | Freq: Every day | ORAL | 4 refills | Status: DC

## 2017-05-22 NOTE — Progress Notes (Signed)
Subjective:    Katelyn LeanCynthia Bender is being seen today for her first obstetrical visit.  This is not a planned pregnancy. She is at 71106w6d gestation. Her obstetrical history is significant for none. Relationship with FOB: Significant other, liviing together and supportive Patient does intend to breast feed. Pregnancy history fully reviewed.  The information documented in the HPI was reviewed and verified.  Menstrual History: OB History    Gravida Para Term Preterm AB Living   4 1 1   2 1    SAB TAB Ectopic Multiple Live Births     2     1       Patient's last menstrual period was 02/24/2017.    History reviewed. No pertinent past medical history.  Past Surgical History:  Procedure Laterality Date  . OVARIAN CYST REMOVAL       (Not in a hospital admission) Allergies  Allergen Reactions  . Penicillins     Social History   Tobacco Use  . Smoking status: Never Smoker  . Smokeless tobacco: Never Used  Substance Use Topics  . Alcohol use: No    Alcohol/week: 0.0 oz    Family History  Problem Relation Age of Onset  . Diabetes Mother   . Hypertension Mother      Review of Systems Constitutional: negative for weight loss Gastrointestinal: negative for vomiting Genitourinary:negative for genital lesions and vaginal discharge and dysuria Musculoskeletal:negative for back pain Behavioral/Psych: negative for abusive relationship, depression, illegal drug usage and tobacco use    Objective:    BP 114/76   Pulse 84   Wt 290 lb 1.6 oz (131.6 kg)   LMP 02/24/2017   BMI 39.34 kg/m  General Appearance:    Alert, cooperative, no distress, appears stated age  Head:    Normocephalic, without obvious abnormality, atraumatic  Eyes:    PERRL, conjunctiva/corneas clear, EOM's intact, fundi    benign, both eyes  Ears:    Normal TM's and external ear canals, both ears  Nose:   Nares normal, septum midline, mucosa normal, no drainage    or sinus tenderness  Throat:   Lips, mucosa, and  tongue normal; teeth and gums normal  Neck:   Supple, symmetrical, trachea midline, no adenopathy;    thyroid:  no enlargement/tenderness/nodules; no carotid   bruit or JVD  Back:     Symmetric, no curvature, ROM normal, no CVA tenderness  Lungs:     Clear to auscultation bilaterally, respirations unlabored  Chest Wall:    No tenderness or deformity   Heart:    Regular rate and rhythm, S1 and S2 normal, no murmur, rub   or gallop  Breast Exam:    No tenderness, masses, or nipple abnormality  Abdomen:     Soft, non-tender, bowel sounds active all four quadrants,    no masses, no organomegaly  Genitalia:    Normal female without lesion, discharge or tenderness  Extremities:   Extremities normal, atraumatic, no cyanosis or edema  Pulses:   2+ and symmetric all extremities  Skin:   Skin color, texture, turgor normal, no rashes or lesions  Lymph nodes:   Cervical, supraclavicular, and axillary nodes normal  Neurologic:   CNII-XII intact, normal strength, sensation and reflexes    throughout      Lab Review Urine pregnancy test Labs reviewed yes Radiologic studies reviewed yes  Assessment and Plan:     1. Supervision of other normal pregnancy, antepartum Rx: - Prenatal Vit-Fe Fumarate-FA (PRENATAL MULTIVITAMIN) TABS tablet; Take  1 tablet by mouth daily at 12 noon. - Cervicovaginal ancillary only - Hemoglobinopathy evaluation - Culture, OB Urine - HgB A1c - Vitamin D (25 hydroxy) - Obstetric Panel, Including HIV  2. History of depression Rx: - traZODone (DESYREL) 50 MG tablet; Take 50 mg by mouth at bedtime. - FLUoxetine (PROZAC) 10 MG tablet; Take 10 mg by mouth daily.  Will D/C at beginning of 3rd Trimester. - busPIRone (BUSPAR) 5 MG tablet; Take 5 mg by mouth 3 (three) times daily.  3. History of herpes genitalis Rx: - valACYclovir (VALTREX) 1000 MG tablet; Take 1 tablet (1,000 mg total) by mouth 2 (two) times daily. Take for 3 days prn each outbreak.  Dispense: 30 tablet;  Refill: 6   Plan:      Prenatal vitamins.  Counseling provided regarding continued use of seat belts, cessation of alcohol consumption, smoking or use of illicit drugs; infection precautions i.e., influenza/TDAP immunizations, toxoplasmosis,CMV, parvovirus, listeria and varicella; workplace safety, exercise during pregnancy; routine dental care, safe medications, sexual activity, hot tubs, saunas, pools, travel, caffeine use, fish and methlymercury, potential toxins, hair treatments, varicose veins Weight gain recommendations per IOM guidelines reviewed: underweight/BMI< 18.5--> gain 28 - 40 lbs; normal weight/BMI 18.5 - 24.9--> gain 25 - 35 lbs; overweight/BMI 25 - 29.9--> gain 15 - 25 lbs; obese/BMI >30->gain  11 - 20 lbs Problem list reviewed and updated. FIRST/CF mutation testing/NIPT/QUAD SCREEN/fragile X/Ashkenazi Jewish population testing/Spinal muscular atrophy discussed: requested. Role of ultrasound in pregnancy discussed; fetal survey: requested. Amniocentesis discussed: not indicated.  No orders of the defined types were placed in this encounter.  No orders of the defined types were placed in this encounter.   Follow up in 4 weeks. 50% of 20 min visit spent on counseling and coordination of care.     Brock Bad MD

## 2017-05-22 NOTE — Progress Notes (Signed)
Patient is in the office for initial ob visit, unplanned pregnancy, fob is involved and living together.

## 2017-05-23 ENCOUNTER — Encounter: Payer: Self-pay | Admitting: Obstetrics

## 2017-05-23 ENCOUNTER — Other Ambulatory Visit: Payer: Self-pay

## 2017-05-23 LAB — CERVICOVAGINAL ANCILLARY ONLY
BACTERIAL VAGINITIS: NEGATIVE
CANDIDA VAGINITIS: NEGATIVE
Chlamydia: NEGATIVE
Neisseria Gonorrhea: NEGATIVE
Trichomonas: NEGATIVE

## 2017-05-23 NOTE — Progress Notes (Signed)
TC to pt requesting letter for cruise.  Ok per Dr.Harper to provide a letter, Pt made aware.  [facility] CMA

## 2017-05-24 LAB — HEMOGLOBINOPATHY EVALUATION
HGB C: 0 %
HGB S: 0 %
HGB VARIANT: 0 %
Hemoglobin A2 Quantitation: 2.4 % (ref 1.8–3.2)
Hemoglobin F Quantitation: 0 % (ref 0.0–2.0)
Hgb A: 97.6 % (ref 96.4–98.8)

## 2017-05-24 LAB — OBSTETRIC PANEL, INCLUDING HIV
ANTIBODY SCREEN: NEGATIVE
Basophils Absolute: 0 10*3/uL (ref 0.0–0.2)
Basos: 0 %
EOS (ABSOLUTE): 0.2 10*3/uL (ref 0.0–0.4)
Eos: 1 %
HIV SCREEN 4TH GENERATION: NONREACTIVE
Hematocrit: 36.3 % (ref 34.0–46.6)
Hemoglobin: 12.3 g/dL (ref 11.1–15.9)
Hepatitis B Surface Ag: NEGATIVE
IMMATURE GRANS (ABS): 0 10*3/uL (ref 0.0–0.1)
Immature Granulocytes: 0 %
LYMPHS ABS: 3.1 10*3/uL (ref 0.7–3.1)
LYMPHS: 21 %
MCH: 30.4 pg (ref 26.6–33.0)
MCHC: 33.9 g/dL (ref 31.5–35.7)
MCV: 90 fL (ref 79–97)
MONOS ABS: 0.7 10*3/uL (ref 0.1–0.9)
Monocytes: 5 %
NEUTROS ABS: 11 10*3/uL — AB (ref 1.4–7.0)
Neutrophils: 73 %
Platelets: 316 10*3/uL (ref 150–379)
RBC: 4.05 x10E6/uL (ref 3.77–5.28)
RDW: 15.3 % (ref 12.3–15.4)
RPR Ser Ql: NONREACTIVE
Rh Factor: POSITIVE
Rubella Antibodies, IGG: 2.41 index (ref >0.99)
WBC: 14.9 10*3/uL — AB (ref 3.4–10.8)

## 2017-05-24 LAB — HEMOGLOBIN A1C
ESTIMATED AVERAGE GLUCOSE: 94 mg/dL
Hgb A1c MFr Bld: 4.9 % (ref 4.8–5.6)

## 2017-05-24 LAB — VITAMIN D 25 HYDROXY (VIT D DEFICIENCY, FRACTURES): Vit D, 25-Hydroxy: 16.3 ng/mL — ABNORMAL LOW (ref 30.0–100.0)

## 2017-05-24 LAB — URINE CULTURE, OB REFLEX

## 2017-05-24 LAB — CULTURE, OB URINE

## 2017-05-25 ENCOUNTER — Other Ambulatory Visit: Payer: Self-pay | Admitting: Obstetrics

## 2017-05-25 MED ORDER — VITAMIN D 50 MCG (2000 UT) PO CAPS: 1.0000 | ORAL_CAPSULE | Freq: Every day | ORAL | 11 refills | Status: DC

## 2017-05-29 ENCOUNTER — Encounter: Payer: Self-pay | Admitting: Family Medicine

## 2017-05-29 DIAGNOSIS — O99212 Obesity complicating pregnancy, second trimester: Secondary | ICD-10-CM | POA: Insufficient documentation

## 2017-05-29 DIAGNOSIS — E669 Obesity, unspecified: Secondary | ICD-10-CM | POA: Insufficient documentation

## 2017-06-19 ENCOUNTER — Encounter: Payer: Self-pay | Admitting: Obstetrics

## 2017-06-19 ENCOUNTER — Ambulatory Visit (INDEPENDENT_AMBULATORY_CARE_PROVIDER_SITE_OTHER): Payer: Medicaid Other | Admitting: Obstetrics

## 2017-06-19 ENCOUNTER — Other Ambulatory Visit: Payer: Self-pay

## 2017-06-19 VITALS — BP 123/83 | HR 74 | Wt 297.7 lb

## 2017-06-19 DIAGNOSIS — Z3482 Encounter for supervision of other normal pregnancy, second trimester: Secondary | ICD-10-CM

## 2017-06-19 DIAGNOSIS — Z3490 Encounter for supervision of normal pregnancy, unspecified, unspecified trimester: Secondary | ICD-10-CM

## 2017-06-19 NOTE — Progress Notes (Signed)
Subjective:  Katelyn Bender is Bender 32 y.o. G4P1021 at 2253w6d being seen today for ongoing prenatal care.  She is currently monitored for the following issues for this low-risk pregnancy and has Hx of ovarian cyst; Supervision of normal pregnancy, antepartum; Obesity (BMI 35.0-39.9 without comorbidity); and Obesity affecting pregnancy, antepartum on their problem list.  Patient reports no complaints.  Contractions: Not present. Vag. Bleeding: None.  Movement: Present. Denies leaking of fluid.   The following portions of the patient's history were reviewed and updated as appropriate: allergies, current medications, past family history, past medical history, past social history, past surgical history and problem list. Problem list updated.  Objective:   Vitals:   06/19/17 1415  BP: 123/83  Pulse: 74  Weight: 297 lb 11.2 oz (135 kg)    Fetal Status: Fetal Heart Rate (bpm): 150   Movement: Present     General:  Alert, oriented and cooperative. Patient is in no acute distress.  Skin: Skin is warm and dry. No rash noted.   Cardiovascular: Normal heart rate noted  Respiratory: Normal respiratory effort, no problems with respiration noted  Abdomen: Soft, gravid, appropriate for gestational age. Pain/Pressure: Absent     Pelvic:  Cervical exam deferred        Extremities: Normal range of motion.  Edema: None  Mental Status: Normal mood and affect. Normal behavior. Normal judgment and thought content.   Urinalysis:      Assessment and Plan:  Pregnancy: G4P1021 at 5253w6d  1. Encounter for supervision of normal pregnancy, antepartum, unspecified gravidity Rx: - US MFM OB COMP + 14 WK; Future  Preterm labor symptoms and general obstetric precautions including but not limited to vaginal bleeding, contractions, leaking of fluid and fetal movement were reviewed in detail with the patient. Please refer to After Visit Summary for other counseling recommendations.  Return in about 4 weeks (around  07/17/2017) for ROB.   Katelyn Bender, Wille Aubuchon A, MD

## 2017-06-19 NOTE — Progress Notes (Signed)
ROB.  Reports no problems today. 

## 2017-07-10 ENCOUNTER — Encounter (HOSPITAL_COMMUNITY): Payer: Self-pay | Admitting: Obstetrics

## 2017-07-16 ENCOUNTER — Encounter: Payer: Medicaid Other | Admitting: Obstetrics

## 2017-07-16 ENCOUNTER — Other Ambulatory Visit: Payer: Self-pay | Admitting: Obstetrics

## 2017-07-16 ENCOUNTER — Ambulatory Visit (INDEPENDENT_AMBULATORY_CARE_PROVIDER_SITE_OTHER): Payer: Medicaid Other | Admitting: Obstetrics

## 2017-07-16 ENCOUNTER — Encounter: Payer: Self-pay | Admitting: Obstetrics

## 2017-07-16 ENCOUNTER — Ambulatory Visit (HOSPITAL_COMMUNITY)
Admission: RE | Admit: 2017-07-16 | Discharge: 2017-07-16 | Disposition: A | Discharge status: Home / Self Care | Payer: Medicaid Other | Source: Ambulatory Visit | Attending: Obstetrics | Admitting: Obstetrics

## 2017-07-16 ENCOUNTER — Other Ambulatory Visit: Payer: Self-pay

## 2017-07-16 VITALS — BP 105/80 | HR 77 | Wt 303.3 lb

## 2017-07-16 DIAGNOSIS — Z3490 Encounter for supervision of normal pregnancy, unspecified, unspecified trimester: Secondary | ICD-10-CM

## 2017-07-16 DIAGNOSIS — Z3A19 19 weeks gestation of pregnancy: Secondary | ICD-10-CM

## 2017-07-16 DIAGNOSIS — O99212 Obesity complicating pregnancy, second trimester: Secondary | ICD-10-CM | POA: Diagnosis not present

## 2017-07-16 DIAGNOSIS — Z3689 Encounter for other specified antenatal screening: Secondary | ICD-10-CM | POA: Insufficient documentation

## 2017-07-16 DIAGNOSIS — Z8659 Personal history of other mental and behavioral disorders: Secondary | ICD-10-CM

## 2017-07-16 DIAGNOSIS — Z3482 Encounter for supervision of other normal pregnancy, second trimester: Secondary | ICD-10-CM

## 2017-07-16 NOTE — Progress Notes (Signed)
Reports no problems today. 

## 2017-07-16 NOTE — Progress Notes (Signed)
Subjective:  Katelyn Bender is a 32 y.o. G4P1021 at 333w5d being seen today for ongoing prenatal care.  She is currently monitored for the following issues for this low-risk pregnancy and has Hx of ovarian cyst; Supervision of normal pregnancy, antepartum; Obesity (BMI 35.0-39.9 without comorbidity); Obesity affecting pregnancy in second trimester; Encounter for fetal anatomic survey; and [redacted] weeks gestation of pregnancy on their problem list.  Patient reports no complaints.  Contractions: Not present. Vag. Bleeding: None.  Movement: Present. Denies leaking of fluid.   The following portions of the patient's history were reviewed and updated as appropriate: allergies, current medications, past family history, past medical history, past social history, past surgical history and problem list. Problem list updated.  Objective:   Vitals:   07/16/17 0923  BP: 105/80  Pulse: 77  Weight: (!) 303 lb 4.8 oz (137.6 kg)    Fetal Status: Fetal Heart Rate (bpm): 150   Movement: Present     General:  Alert, oriented and cooperative. Patient is in no acute distress.  Skin: Skin is warm and dry. No rash noted.   Cardiovascular: Normal heart rate noted  Respiratory: Normal respiratory effort, no problems with respiration noted  Abdomen: Soft, gravid, appropriate for gestational age. Pain/Pressure: Absent     Pelvic:  Cervical exam deferred        Extremities: Normal range of motion.  Edema: None  Mental Status: Normal mood and affect. Normal behavior. Normal judgment and thought content.   Urinalysis:      Assessment and Plan:  Pregnancy: G4P1021 at 523w5d  1. Encounter for supervision of normal pregnancy, antepartum, unspecified gravidity   2. History of depression   Preterm labor symptoms and general obstetric precautions including but not limited to vaginal bleeding, contractions, leaking of fluid and fetal movement were reviewed in detail with the patient. Please refer to After Visit Summary  for other counseling recommendations.  Return in about 1 month (around 08/13/2017) for ROB.   Brock BadHarper, Charles A, MD

## 2017-08-13 ENCOUNTER — Ambulatory Visit (INDEPENDENT_AMBULATORY_CARE_PROVIDER_SITE_OTHER): Payer: Medicaid Other | Admitting: Obstetrics

## 2017-08-13 ENCOUNTER — Encounter: Payer: Self-pay | Admitting: Obstetrics

## 2017-08-13 VITALS — BP 110/74 | HR 79 | Temp 98.7°F | Wt 309.5 lb

## 2017-08-13 DIAGNOSIS — Z8659 Personal history of other mental and behavioral disorders: Secondary | ICD-10-CM

## 2017-08-13 DIAGNOSIS — Z3490 Encounter for supervision of normal pregnancy, unspecified, unspecified trimester: Secondary | ICD-10-CM

## 2017-08-13 DIAGNOSIS — Z3482 Encounter for supervision of other normal pregnancy, second trimester: Secondary | ICD-10-CM

## 2017-08-13 NOTE — Progress Notes (Signed)
Subjective:  Katelyn Bender is a 32 y.o. G4P1021 at [redacted]w[redacted]d being seen today for ongoing prenatal care.  She is currently monitored for the following issues for this low-risk pregnancy and has Hx of ovarian cyst; Supervision of normal pregnancy, antepartum; Obesity (BMI 35.0-39.9 without comorbidity); Obesity affecting pregnancy in second trimester; Encounter for fetal anatomic survey; and [redacted] weeks gestation of pregnancy on their problem list.  Patient reports no complaints.  Contractions: Not present. Vag. Bleeding: None.  Movement: Present. Denies leaking of fluid.   The following portions of the patient's history were reviewed and updated as appropriate: allergies, current medications, past family history, past medical history, past social history, past surgical history and problem list. Problem list updated.  Objective:   Vitals:   08/13/17 0916  BP: 110/74  Pulse: 79  Temp: 98.7 F (37.1 C)  Weight: (!) 309 lb 8 oz (140.4 kg)    Fetal Status:     Movement: Present     General:  Alert, oriented and cooperative. Patient is in no acute distress.  Skin: Skin is warm and dry. No rash noted.   Cardiovascular: Normal heart rate noted  Respiratory: Normal respiratory effort, no problems with respiration noted  Abdomen: Soft, gravid, appropriate for gestational age. Pain/Pressure: Absent     Pelvic:  Cervical exam deferred        Extremities: Normal range of motion.  Edema: None  Mental Status: Normal mood and affect. Normal behavior. Normal judgment and thought content.   Urinalysis:      Assessment and Plan:  Pregnancy: G4P1021 at [redacted]w[redacted]d  1. Encounter for supervision of normal pregnancy, antepartum, unspecified gravidity   2. History of depression - stable  Preterm labor symptoms and general obstetric precautions including but not limited to vaginal bleeding, contractions, leaking of fluid and fetal movement were reviewed in detail with the patient. Please refer to After Visit  Summary for other counseling recommendations.  Return in about 1 month (around 09/10/2017) for ROB.   Brock Bad, MD

## 2017-09-10 ENCOUNTER — Encounter: Payer: Self-pay | Admitting: Obstetrics

## 2017-09-10 ENCOUNTER — Ambulatory Visit (INDEPENDENT_AMBULATORY_CARE_PROVIDER_SITE_OTHER): Payer: Medicaid Other | Admitting: Obstetrics

## 2017-09-10 VITALS — BP 119/77 | HR 83 | Wt 314.0 lb

## 2017-09-10 DIAGNOSIS — Z3490 Encounter for supervision of normal pregnancy, unspecified, unspecified trimester: Secondary | ICD-10-CM

## 2017-09-10 DIAGNOSIS — E669 Obesity, unspecified: Secondary | ICD-10-CM

## 2017-09-10 DIAGNOSIS — Z3689 Encounter for other specified antenatal screening: Secondary | ICD-10-CM

## 2017-09-10 DIAGNOSIS — Z3482 Encounter for supervision of other normal pregnancy, second trimester: Secondary | ICD-10-CM

## 2017-09-10 NOTE — Progress Notes (Signed)
Subjective:  Katelyn Bender is a 32 y.o. G4P1021 at [redacted]w[redacted]d being seen today for ongoing prenatal care.  She is currently monitored for the following issues for this high-risk pregnancy and has Hx of ovarian cyst; Supervision of normal pregnancy, antepartum; Obesity (BMI 35.0-39.9 without comorbidity); Obesity affecting pregnancy in second trimester; Encounter for fetal anatomic survey; and [redacted] weeks gestation of pregnancy on their problem list.  Patient reports no complaints.  Contractions: Not present. Vag. Bleeding: None.  Movement: Present. Denies leaking of fluid.   The following portions of the patient's history were reviewed and updated as appropriate: allergies, current medications, past family history, past medical history, past social history, past surgical history and problem list. Problem list updated.  Objective:   Vitals:   09/10/17 0939  BP: 119/77  Pulse: 83  Weight: (!) 314 lb (142.4 kg)    Fetal Status: Fetal Heart Rate (bpm): 150   Movement: Present     General:  Alert, oriented and cooperative. Patient is in no acute distress.  Skin: Skin is warm and dry. No rash noted.   Cardiovascular: Normal heart rate noted  Respiratory: Normal respiratory effort, no problems with respiration noted  Abdomen: Soft, gravid, appropriate for gestational age. Pain/Pressure: Absent     Pelvic:  Cervical exam deferred        Extremities: Normal range of motion.  Edema: None  Mental Status: Normal mood and affect. Normal behavior. Normal judgment and thought content.   Urinalysis:      Assessment and Plan:  Pregnancy: G4P1021 at [redacted]w[redacted]d  1. Encounter for supervision of normal pregnancy, antepartum, unspecified gravidity  2. Obesity (BMI 35.0-39.9 without comorbidity)  3. Encounter for ultrasound to assess interval growth of fetus Rx: - [place] MFM OB COMP + 14 WK; Future - [place] MFM OB FOLLOW UP; Future  Preterm labor symptoms and general obstetric precautions including but not limited to  vaginal bleeding, contractions, leaking of fluid and fetal movement were reviewed in detail with the patient. Please refer to After Visit Summary for other counseling recommendations.  Return in about 2 weeks (around 09/24/2017).   Brock Bad, MD

## 2017-09-16 ENCOUNTER — Other Ambulatory Visit: Payer: Medicaid Other

## 2017-09-16 ENCOUNTER — Encounter: Payer: Medicaid Other | Admitting: Obstetrics

## 2017-09-16 DIAGNOSIS — Z3482 Encounter for supervision of other normal pregnancy, second trimester: Secondary | ICD-10-CM

## 2017-09-17 LAB — GLUCOSE TOLERANCE, 2 HOURS W/ 1HR
GLUCOSE, FASTING: 78 mg/dL (ref 65–91)
Glucose, 1 hour: 79 mg/dL (ref 65–179)
Glucose, 2 hour: 78 mg/dL (ref 65–152)

## 2017-09-17 LAB — CBC
HEMATOCRIT: 34.2 % (ref 34.0–46.6)
HEMOGLOBIN: 11.2 g/dL (ref 11.1–15.9)
MCH: 29.1 pg (ref 26.6–33.0)
MCHC: 32.7 g/dL (ref 31.5–35.7)
MCV: 89 fL (ref 79–97)
Platelets: 317 10*3/uL (ref 150–450)
RBC: 3.85 x10E6/uL (ref 3.77–5.28)
RDW: 15.5 % — ABNORMAL HIGH (ref 12.3–15.4)
WBC: 14.8 10*3/uL — ABNORMAL HIGH (ref 3.4–10.8)

## 2017-09-17 LAB — RPR: RPR: NONREACTIVE

## 2017-09-17 LAB — HIV ANTIBODY (ROUTINE TESTING W REFLEX): HIV SCREEN 4TH GENERATION: NONREACTIVE

## 2017-09-20 ENCOUNTER — Ambulatory Visit (HOSPITAL_COMMUNITY): Payer: Medicaid Other

## 2017-09-26 ENCOUNTER — Other Ambulatory Visit: Payer: Self-pay | Admitting: Obstetrics

## 2017-09-26 ENCOUNTER — Ambulatory Visit (HOSPITAL_COMMUNITY)
Admission: RE | Admit: 2017-09-26 | Discharge: 2017-09-26 | Disposition: A | Discharge status: Home / Self Care | Payer: Medicaid Other | Source: Ambulatory Visit | Attending: Obstetrics | Admitting: Obstetrics

## 2017-09-26 DIAGNOSIS — O99213 Obesity complicating pregnancy, third trimester: Secondary | ICD-10-CM | POA: Insufficient documentation

## 2017-09-26 DIAGNOSIS — Z3689 Encounter for other specified antenatal screening: Secondary | ICD-10-CM

## 2017-09-26 DIAGNOSIS — Z362 Encounter for other antenatal screening follow-up: Secondary | ICD-10-CM

## 2017-09-26 DIAGNOSIS — Z3A30 30 weeks gestation of pregnancy: Secondary | ICD-10-CM | POA: Diagnosis not present

## 2017-10-08 ENCOUNTER — Ambulatory Visit (INDEPENDENT_AMBULATORY_CARE_PROVIDER_SITE_OTHER): Payer: Medicaid Other | Admitting: Obstetrics

## 2017-10-08 ENCOUNTER — Other Ambulatory Visit: Payer: Self-pay

## 2017-10-08 ENCOUNTER — Encounter: Payer: Self-pay | Admitting: Obstetrics

## 2017-10-08 VITALS — BP 111/74 | HR 82 | Wt 319.0 lb

## 2017-10-08 DIAGNOSIS — O99213 Obesity complicating pregnancy, third trimester: Secondary | ICD-10-CM

## 2017-10-08 DIAGNOSIS — Z3490 Encounter for supervision of normal pregnancy, unspecified, unspecified trimester: Secondary | ICD-10-CM

## 2017-10-08 DIAGNOSIS — E669 Obesity, unspecified: Secondary | ICD-10-CM

## 2017-10-08 DIAGNOSIS — Z8659 Personal history of other mental and behavioral disorders: Secondary | ICD-10-CM

## 2017-10-08 DIAGNOSIS — Z3483 Encounter for supervision of other normal pregnancy, third trimester: Secondary | ICD-10-CM

## 2017-10-08 NOTE — Progress Notes (Signed)
Subjective:  Katelyn LeanCynthia Bender is a 32 y.o. G4P1021 at 765w5d being seen today for ongoing prenatal care.  She is currently monitored for the following issues for this low-risk pregnancy and has Hx of ovarian cyst; Supervision of normal pregnancy, antepartum; Obesity (BMI 35.0-39.9 without comorbidity); Obesity affecting pregnancy in second trimester; Encounter for fetal anatomic survey; and [redacted] weeks gestation of pregnancy on their problem list.  Patient reports no complaints.  Contractions: Not present. Vag. Bleeding: None.  Movement: Present. Denies leaking of fluid.   The following portions of the patient's history were reviewed and updated as appropriate: allergies, current medications, past family history, past medical history, past social history, past surgical history and problem list. Problem list updated.  Objective:   Vitals:   10/08/17 0858  BP: 111/74  Pulse: 82  Weight: (!) 319 lb (144.7 kg)    Fetal Status: Fetal Heart Rate (bpm): 150   Movement: Present     General:  Alert, oriented and cooperative. Patient is in no acute distress.  Skin: Skin is warm and dry. No rash noted.   Cardiovascular: Normal heart rate noted  Respiratory: Normal respiratory effort, no problems with respiration noted  Abdomen: Soft, gravid, appropriate for gestational age. Pain/Pressure: Absent     Pelvic:  Cervical exam deferred        Extremities: Normal range of motion.  Edema: None  Mental Status: Normal mood and affect. Normal behavior. Normal judgment and thought content.   Urinalysis:      Assessment and Plan:  Pregnancy: G4P1021 at [redacted]w[redacted]d  1. Encounter for supervision of normal pregnancy, antepartum, unspecified gravidity  2. Obesity (BMI 35.0-39.9 without comorbidity)  3. History of depression   Preterm labor symptoms and general obstetric precautions including but not limited to vaginal bleeding, contractions, leaking of fluid and fetal movement were reviewed in detail with the  patient. Please refer to After Visit Summary for other counseling recommendations.  Return in about 2 weeks (around 10/22/2017) for ROB.   Brock BadHarper, Chianne Byrns A, MD

## 2017-10-22 ENCOUNTER — Ambulatory Visit (INDEPENDENT_AMBULATORY_CARE_PROVIDER_SITE_OTHER): Payer: Medicaid Other | Admitting: Obstetrics

## 2017-10-22 ENCOUNTER — Encounter: Payer: Self-pay | Admitting: Obstetrics

## 2017-10-22 VITALS — BP 117/80 | HR 84 | Wt 317.8 lb

## 2017-10-22 DIAGNOSIS — Z3483 Encounter for supervision of other normal pregnancy, third trimester: Secondary | ICD-10-CM

## 2017-10-22 DIAGNOSIS — E669 Obesity, unspecified: Secondary | ICD-10-CM

## 2017-10-22 DIAGNOSIS — Z3490 Encounter for supervision of normal pregnancy, unspecified, unspecified trimester: Secondary | ICD-10-CM

## 2017-10-22 NOTE — Progress Notes (Signed)
Subjective:  Katelyn LeanCynthia Bender is a 32 y.o. G4P1021 at 2725w5d being seen today for ongoing prenatal care.  She is currently monitored for the following issues for this low-risk pregnancy and has Hx of ovarian cyst; Supervision of normal pregnancy, antepartum; Obesity (BMI 35.0-39.9 without comorbidity); Obesity affecting pregnancy in second trimester; Encounter for fetal anatomic survey; and [redacted] weeks gestation of pregnancy on their problem list.  Patient reports occasional contractions.  Contractions: Irregular. Vag. Bleeding: None.  Movement: Present. Denies leaking of fluid.   The following portions of the patient's history were reviewed and updated as appropriate: allergies, current medications, past family history, past medical history, past social history, past surgical history and problem list. Problem list updated.  Objective:   Vitals:   10/22/17 0949  BP: 117/80  Pulse: 84  Weight: (!) 317 lb 12.8 oz (144.2 kg)    Fetal Status: Fetal Heart Rate (bpm): 150   Movement: Present     General:  Alert, oriented and cooperative. Patient is in no acute distress.  Skin: Skin is warm and dry. No rash noted.   Cardiovascular: Normal heart rate noted  Respiratory: Normal respiratory effort, no problems with respiration noted  Abdomen: Soft, gravid, appropriate for gestational age. Pain/Pressure: Absent     Pelvic:  Cervical exam deferred        Extremities: Normal range of motion.  Edema: None  Mental Status: Normal mood and affect. Normal behavior. Normal judgment and thought content.   Urinalysis:      Assessment and Plan:  Pregnancy: G4P1021 at 3825w5d  1. Encounter for supervision of normal pregnancy, antepartum, unspecified gravidity  2. Obesity (BMI 35.0-39.9 without comorbidity)   Preterm labor symptoms and general obstetric precautions including but not limited to vaginal bleeding, contractions, leaking of fluid and fetal movement were reviewed in detail with the patient. Please  refer to After Visit Summary for other counseling recommendations.  Return in about 2 weeks (around 11/05/2017) for ROB.   Brock BadHarper, Trace Wirick A, MD

## 2017-10-22 NOTE — Progress Notes (Signed)
Patient reports good fetal movement with some irregular contractions. 

## 2017-11-05 ENCOUNTER — Other Ambulatory Visit (HOSPITAL_COMMUNITY)
Admission: RE | Admit: 2017-11-05 | Discharge: 2017-11-05 | Disposition: A | Discharge status: Home / Self Care | Payer: Medicaid Other | Source: Ambulatory Visit | Attending: Obstetrics | Admitting: Obstetrics

## 2017-11-05 ENCOUNTER — Encounter: Payer: Self-pay | Admitting: Obstetrics

## 2017-11-05 ENCOUNTER — Ambulatory Visit (INDEPENDENT_AMBULATORY_CARE_PROVIDER_SITE_OTHER): Payer: Medicaid Other | Admitting: Obstetrics

## 2017-11-05 VITALS — BP 109/74 | HR 88 | Wt 325.4 lb

## 2017-11-05 DIAGNOSIS — Z3A35 35 weeks gestation of pregnancy: Secondary | ICD-10-CM | POA: Diagnosis not present

## 2017-11-05 DIAGNOSIS — Z8659 Personal history of other mental and behavioral disorders: Secondary | ICD-10-CM

## 2017-11-05 DIAGNOSIS — Z3480 Encounter for supervision of other normal pregnancy, unspecified trimester: Secondary | ICD-10-CM

## 2017-11-05 DIAGNOSIS — Z3483 Encounter for supervision of other normal pregnancy, third trimester: Secondary | ICD-10-CM | POA: Diagnosis present

## 2017-11-05 DIAGNOSIS — O99213 Obesity complicating pregnancy, third trimester: Secondary | ICD-10-CM

## 2017-11-05 DIAGNOSIS — Z8619 Personal history of other infectious and parasitic diseases: Secondary | ICD-10-CM

## 2017-11-05 DIAGNOSIS — E669 Obesity, unspecified: Secondary | ICD-10-CM

## 2017-11-05 NOTE — Progress Notes (Signed)
Subjective:  Katelyn Bender is a 6Wer[age] Lean89 y.o. G4P1021 at 6645w5d being seen today for ongoing prenatal care.  She is currently monitored for the following issues for this low-risk pregnancy and has Hx of ovarian cyst; Supervision of normal pregnancy, antepartum; Obesity (BMI 35.0-39.9 without comorbidity); Obesity affecting pregnancy in second trimester; Encounter for fetal anatomic survey; and [redacted] weeks gestation of pregnancy on their problem list.  Patient reports no complaints.  Contractions: Irregular. Vag. Bleeding: None.  Movement: Present. Denies leaking of fluid.   The following portions of the patient's history were reviewed and updated as appropriate: allergies, current medications, past family history, past medical history, past social history, past surgical history and problem list. Problem list updated.  Objective:   Vitals:   11/05/17 1343  BP: 109/74  Pulse: 88  Weight: (!) 325 lb 6.4 oz (147.6 kg)    Fetal Status: Fetal Heart Rate (bpm): 150   Movement: Present     General:  Alert, oriented and cooperative. Patient is in no acute distress.  Skin: Skin is warm and dry. No rash noted.   Cardiovascular: Normal heart rate noted  Respiratory: Normal respiratory effort, no problems with respiration noted  Abdomen: Soft, gravid, appropriate for gestational age. Pain/Pressure: Absent     Pelvic:  Cervical exam deferred        Extremities: Normal range of motion.  Edema: None  Mental Status: Normal mood and affect. Normal behavior. Normal judgment and thought content.   Urinalysis:      Assessment and Plan:  Pregnancy: [id number] at 2245w5d : 1. Supervision of other normal pregnancy, antepartum Rx - Strep Gp B NAA - Cervicovaginal ancillary only  Preterm labor symptoms and general obstetric precautions including but not limited to vaginal bleeding, contractions, leaking of fluid and fetal movement were reviewed in detail with the patient. Please refer to After Visit Summary for other  counseling recommendations.  No follow-ups on file.   Brock BadHarper, Peighton Mehra A, MD

## 2017-11-05 NOTE — Progress Notes (Signed)
Patient reports good fetal movement with irregular contractions.  

## 2017-11-06 LAB — CERVICOVAGINAL ANCILLARY ONLY
BACTERIAL VAGINITIS: NEGATIVE
Candida vaginitis: POSITIVE — AB
Chlamydia: NEGATIVE
Neisseria Gonorrhea: NEGATIVE
Trichomonas: NEGATIVE

## 2017-11-07 ENCOUNTER — Other Ambulatory Visit: Payer: Self-pay | Admitting: Obstetrics

## 2017-11-07 DIAGNOSIS — B3731 Acute candidiasis of vulva and vagina: Secondary | ICD-10-CM

## 2017-11-07 DIAGNOSIS — B373 Candidiasis of vulva and vagina: Secondary | ICD-10-CM

## 2017-11-07 LAB — STREP GP B NAA: STREP GROUP B AG: POSITIVE — AB

## 2017-11-07 MED ORDER — FLUCONAZOLE 150 MG PO TABS: 150.0000 mg | ORAL_TABLET | Freq: Once | ORAL | 0 refills | Status: AC

## 2017-11-13 ENCOUNTER — Encounter: Payer: Self-pay | Admitting: Obstetrics

## 2017-11-13 ENCOUNTER — Ambulatory Visit (INDEPENDENT_AMBULATORY_CARE_PROVIDER_SITE_OTHER): Payer: Medicaid Other | Admitting: Obstetrics

## 2017-11-13 VITALS — BP 135/87 | HR 96 | Wt 322.0 lb

## 2017-11-13 DIAGNOSIS — E669 Obesity, unspecified: Secondary | ICD-10-CM

## 2017-11-13 DIAGNOSIS — Z3480 Encounter for supervision of other normal pregnancy, unspecified trimester: Secondary | ICD-10-CM

## 2017-11-13 DIAGNOSIS — B373 Candidiasis of vulva and vagina: Secondary | ICD-10-CM

## 2017-11-13 DIAGNOSIS — B3731 Acute candidiasis of vulva and vagina: Secondary | ICD-10-CM

## 2017-11-13 DIAGNOSIS — Z3483 Encounter for supervision of other normal pregnancy, third trimester: Secondary | ICD-10-CM

## 2017-11-13 MED ORDER — FLUCONAZOLE 150 MG PO TABS: 150.0000 mg | ORAL_TABLET | Freq: Once | ORAL | 0 refills | Status: AC

## 2017-11-13 NOTE — Progress Notes (Signed)
Subjective:  Katelyn LeanCynthia Lingle is a 32 y.o. G4P1021 at 5750w6d being seen today for ongoing prenatal care.  She is currently monitored for the following issues for this low-risk pregnancy and has Hx of ovarian cyst; Supervision of normal pregnancy, antepartum; Obesity (BMI 35.0-39.9 without comorbidity); Obesity affecting pregnancy in second trimester; Encounter for fetal anatomic survey; and [redacted] weeks gestation of pregnancy on their problem list.  Patient reports backache and occasional contractions.  Contractions: Irregular. Vag. Bleeding: None.  Movement: Present. Denies leaking of fluid.   The following portions of the patient's history were reviewed and updated as appropriate: allergies, current medications, past family history, past medical history, past social history, past surgical history and problem list. Problem list updated.  Objective:   Vitals:   11/13/17 1019  BP: 135/87  Pulse: 96  Weight: (!) 322 lb (146.1 kg)    Fetal Status: Fetal Heart Rate (bpm): 150   Movement: Present     General:  Alert, oriented and cooperative. Patient is in no acute distress.  Skin: Skin is warm and dry. No rash noted.   Cardiovascular: Normal heart rate noted  Respiratory: Normal respiratory effort, no problems with respiration noted  Abdomen: Soft, gravid, appropriate for gestational age. Pain/Pressure: Absent     Pelvic:  Cervical exam deferred        Extremities: Normal range of motion.  Edema: None  Mental Status: Normal mood and affect. Normal behavior. Normal judgment and thought content.   Urinalysis:      Assessment and Plan:  Pregnancy: G4P1021 at 3650w6d  1. Supervision of other normal pregnancy, antepartum  2. Obesity (BMI 35.0-39.9 without comorbidity)  3. Candida vaginitis Rx: - fluconazole (DIFLUCAN) 150 MG tablet; Take 1 tablet (150 mg total) by mouth once for 1 dose.  Dispense: 1 tablet; Refill: 0  Term labor symptoms and general obstetric precautions including but not  limited to vaginal bleeding, contractions, leaking of fluid and fetal movement were reviewed in detail with the patient. Please refer to After Visit Summary for other counseling recommendations.  Return in about 1 week (around 11/20/2017) for ROB.   Brock BadHarper, Charles A, MD

## 2017-11-20 ENCOUNTER — Ambulatory Visit (INDEPENDENT_AMBULATORY_CARE_PROVIDER_SITE_OTHER): Payer: Medicaid Other | Admitting: Obstetrics

## 2017-11-20 ENCOUNTER — Encounter: Payer: Self-pay | Admitting: Obstetrics

## 2017-11-20 DIAGNOSIS — Z3480 Encounter for supervision of other normal pregnancy, unspecified trimester: Secondary | ICD-10-CM

## 2017-11-20 DIAGNOSIS — Z3483 Encounter for supervision of other normal pregnancy, third trimester: Secondary | ICD-10-CM

## 2017-11-20 NOTE — Progress Notes (Signed)
No unsual complaints today per pt.

## 2017-11-20 NOTE — Progress Notes (Signed)
Subjective:  Katelyn LeanCynthia Bender is a 32 y.o. G4P1021 at 1886w6d being seen today for ongoing prenatal care.  She is currently monitored for the following issues for this low-risk pregnancy and has Hx of ovarian cyst; Supervision of normal pregnancy, antepartum; Obesity (BMI 35.0-39.9 without comorbidity); Obesity affecting pregnancy in second trimester; Encounter for fetal anatomic survey; and [redacted] weeks gestation of pregnancy on their problem list.  Patient reports no complaints.  Contractions: Irregular. Vag. Bleeding: None.  Movement: Present. Denies leaking of fluid.   The following portions of the patient's history were reviewed and updated as appropriate: allergies, current medications, past family history, past medical history, past social history, past surgical history and problem list. Problem list updated.  Objective:   Vitals:   11/20/17 1018  BP: 109/74  Pulse: 86  Weight: (!) 323 lb (146.5 kg)    Fetal Status: Fetal Heart Rate (bpm): 150   Movement: Present     General:  Alert, oriented and cooperative. Patient is in no acute distress.  Skin: Skin is warm and dry. No rash noted.   Cardiovascular: Normal heart rate noted  Respiratory: Normal respiratory effort, no problems with respiration noted  Abdomen: Soft, gravid, appropriate for gestational age. Pain/Pressure: Present     Pelvic:  Cervical exam deferred        Extremities: Normal range of motion.  Edema: None  Mental Status: Normal mood and affect. Normal behavior. Normal judgment and thought content.   Urinalysis:      Assessment and Plan:  Pregnancy: G4P1021 at 1686w6d  1. Supervision of other normal pregnancy, antepartum   Term labor symptoms and general obstetric precautions including but not limited to vaginal bleeding, contractions, leaking of fluid and fetal movement were reviewed in detail with the patient. Please refer to After Visit Summary for other counseling recommendations.  Return in about 1 week (around  11/27/2017) for ROB.   Brock BadHarper, Fraidy Mccarrick A, MD

## 2017-11-27 ENCOUNTER — Encounter: Payer: Self-pay | Admitting: Obstetrics

## 2017-11-27 ENCOUNTER — Ambulatory Visit (INDEPENDENT_AMBULATORY_CARE_PROVIDER_SITE_OTHER): Payer: Medicaid Other | Admitting: Obstetrics

## 2017-11-27 ENCOUNTER — Encounter: Payer: Medicaid Other | Admitting: Obstetrics

## 2017-11-27 VITALS — BP 108/75 | HR 96 | Wt 322.8 lb

## 2017-11-27 DIAGNOSIS — E669 Obesity, unspecified: Secondary | ICD-10-CM

## 2017-11-27 DIAGNOSIS — Z3483 Encounter for supervision of other normal pregnancy, third trimester: Secondary | ICD-10-CM

## 2017-11-27 DIAGNOSIS — Z8659 Personal history of other mental and behavioral disorders: Secondary | ICD-10-CM

## 2017-11-27 DIAGNOSIS — Z3480 Encounter for supervision of other normal pregnancy, unspecified trimester: Secondary | ICD-10-CM

## 2017-11-27 NOTE — Progress Notes (Signed)
Subjective:  Katelyn Bender is a 32 y.o. G4P1021 at 435w6d being seen today for ongoing prenatal care.  She is currently monitored for the following issues for this low-risk pregnancy and has Hx of ovarian cyst; Supervision of normal pregnancy, antepartum; Obesity (BMI 35.0-39.9 without comorbidity); Obesity affecting pregnancy in second trimester; Encounter for fetal anatomic survey; and [redacted] weeks gestation of pregnancy on their problem list.  Patient reports no complaints.  Contractions: Irregular. Vag. Bleeding: None.  Movement: Present. Denies leaking of fluid.   The following portions of the patient's history were reviewed and updated as appropriate: allergies, current medications, past family history, past medical history, past social history, past surgical history and problem list. Problem list updated.  Objective:   Vitals:   11/27/17 1608  BP: 108/75  Pulse: 96  Weight: (!) 322 lb 12.8 oz (146.4 kg)    Fetal Status: Fetal Heart Rate (bpm): 150   Movement: Present     General:  Alert, oriented and cooperative. Patient is in no acute distress.  Skin: Skin is warm and dry. No rash noted.   Cardiovascular: Normal heart rate noted  Respiratory: Normal respiratory effort, no problems with respiration noted  Abdomen: Soft, gravid, appropriate for gestational age. Pain/Pressure: Present     Pelvic:  Cervical exam deferred        Extremities: Normal range of motion.  Edema: None  Mental Status: Normal mood and affect. Normal behavior. Normal judgment and thought content.   Urinalysis:      Assessment and Plan:  Pregnancy: G4P1021 at 975w6d  1. Supervision of other normal pregnancy, antepartum  2. Obesity (BMI 35.0-39.9 without comorbidity  3. History of depression   Term labor symptoms and general obstetric precautions including but not limited to vaginal bleeding, contractions, leaking of fluid and fetal movement were reviewed in detail with the patient. Please refer to After  Visit Summary for other counseling recommendations.  Return in about 1 week (around 12/04/2017) for ROB.   Brock BadHarper, Melayah Skorupski A, MD

## 2017-11-28 ENCOUNTER — Telehealth (HOSPITAL_COMMUNITY): Payer: Self-pay | Admitting: *Deleted

## 2017-11-28 ENCOUNTER — Encounter (HOSPITAL_COMMUNITY): Payer: Self-pay | Admitting: *Deleted

## 2017-11-28 NOTE — Telephone Encounter (Signed)
Preadmission screen  

## 2017-12-03 ENCOUNTER — Ambulatory Visit (INDEPENDENT_AMBULATORY_CARE_PROVIDER_SITE_OTHER): Payer: Medicaid Other | Admitting: Obstetrics

## 2017-12-03 ENCOUNTER — Encounter: Payer: Self-pay | Admitting: Obstetrics

## 2017-12-03 DIAGNOSIS — Z3483 Encounter for supervision of other normal pregnancy, third trimester: Secondary | ICD-10-CM

## 2017-12-03 DIAGNOSIS — Z3480 Encounter for supervision of other normal pregnancy, unspecified trimester: Secondary | ICD-10-CM

## 2017-12-03 NOTE — Progress Notes (Signed)
Pt presents for ROB w/o complaints today. IOL scheduled 12/12/17.

## 2017-12-03 NOTE — Progress Notes (Signed)
Subjective:  Katelyn Bender is a 32 y.o. G4P1021 at 3543w5d being seen today for ongoing prenatal care.  She is currently monitored for the following issues for this low-risk pregnancy and has Hx of ovarian cyst; Supervision of normal pregnancy, antepartum; Obesity (BMI 35.0-39.9 without comorbidity); Obesity affecting pregnancy in second trimester; Encounter for fetal anatomic survey; and [redacted] weeks gestation of pregnancy on their problem list.  Patient reports no complaints.  Contractions: Irregular. Vag. Bleeding: None.  Movement: Present. Denies leaking of fluid.   The following portions of the patient's history were reviewed and updated as appropriate: allergies, current medications, past family history, past medical history, past social history, past surgical history and problem list. Problem list updated.  Objective:   Vitals:   12/03/17 1359  BP: 126/81  Pulse: 87  Weight: (!) 323 lb 9.6 oz (146.8 kg)    Fetal Status: Fetal Heart Rate (bpm): 150   Movement: Present     General:  Alert, oriented and cooperative. Patient is in no acute distress.  Skin: Skin is warm and dry. No rash noted.   Cardiovascular: Normal heart rate noted  Respiratory: Normal respiratory effort, no problems with respiration noted  Abdomen: Soft, gravid, appropriate for gestational age. Pain/Pressure: Present     Pelvic:  Cervical exam deferred        Extremities: Normal range of motion.  Edema: None  Mental Status: Normal mood and affect. Normal behavior. Normal judgment and thought content.   Urinalysis:      Assessment and Plan:  Pregnancy: G4P1021 at 7143w5d  1. Supervision of other normal pregnancy, antepartum   Term labor symptoms and general obstetric precautions including but not limited to vaginal bleeding, contractions, leaking of fluid and fetal movement were reviewed in detail with the patient. Please refer to After Visit Summary for other counseling recommendations.  Return in about 1 week  (around 12/10/2017) for ROB.   Brock BadHarper, Dyrell Tuccillo A, MD

## 2017-12-04 ENCOUNTER — Encounter: Payer: Medicaid Other | Admitting: Obstetrics

## 2017-12-08 ENCOUNTER — Other Ambulatory Visit: Payer: Self-pay | Admitting: Family Medicine

## 2017-12-10 ENCOUNTER — Other Ambulatory Visit: Payer: Self-pay

## 2017-12-10 ENCOUNTER — Inpatient Hospital Stay (HOSPITAL_COMMUNITY): Payer: Medicaid Other | Admitting: Anesthesiology

## 2017-12-10 ENCOUNTER — Encounter (HOSPITAL_COMMUNITY)
Admission: AD | Disposition: A | Discharge status: Home / Self Care | Payer: Self-pay | Source: Home / Self Care | Attending: Obstetrics & Gynecology

## 2017-12-10 ENCOUNTER — Encounter (HOSPITAL_COMMUNITY): Payer: Self-pay | Admitting: *Deleted

## 2017-12-10 ENCOUNTER — Inpatient Hospital Stay (HOSPITAL_COMMUNITY)
Admission: AD | Admit: 2017-12-10 | Discharge: 2017-12-13 | DRG: 787 | Disposition: A | Discharge status: Home / Self Care | Payer: Medicaid Other | Attending: Obstetrics & Gynecology | Admitting: Obstetrics & Gynecology

## 2017-12-10 DIAGNOSIS — O99824 Streptococcus B carrier state complicating childbirth: Secondary | ICD-10-CM | POA: Diagnosis present

## 2017-12-10 DIAGNOSIS — A6000 Herpesviral infection of urogenital system, unspecified: Secondary | ICD-10-CM | POA: Diagnosis present

## 2017-12-10 DIAGNOSIS — O99212 Obesity complicating pregnancy, second trimester: Secondary | ICD-10-CM

## 2017-12-10 DIAGNOSIS — O480 Post-term pregnancy: Secondary | ICD-10-CM | POA: Diagnosis present

## 2017-12-10 DIAGNOSIS — O3483 Maternal care for other abnormalities of pelvic organs, third trimester: Secondary | ICD-10-CM | POA: Diagnosis present

## 2017-12-10 DIAGNOSIS — N83202 Unspecified ovarian cyst, left side: Secondary | ICD-10-CM | POA: Diagnosis present

## 2017-12-10 DIAGNOSIS — O99214 Obesity complicating childbirth: Secondary | ICD-10-CM | POA: Diagnosis present

## 2017-12-10 DIAGNOSIS — Z880 Allergy status to penicillin: Secondary | ICD-10-CM

## 2017-12-10 DIAGNOSIS — Z8742 Personal history of other diseases of the female genital tract: Secondary | ICD-10-CM

## 2017-12-10 DIAGNOSIS — O9832 Other infections with a predominantly sexual mode of transmission complicating childbirth: Secondary | ICD-10-CM | POA: Diagnosis present

## 2017-12-10 DIAGNOSIS — O4292 Full-term premature rupture of membranes, unspecified as to length of time between rupture and onset of labor: Secondary | ICD-10-CM | POA: Diagnosis present

## 2017-12-10 DIAGNOSIS — A6009 Herpesviral infection of other urogenital tract: Secondary | ICD-10-CM

## 2017-12-10 DIAGNOSIS — O9852 Other viral diseases complicating childbirth: Secondary | ICD-10-CM | POA: Diagnosis present

## 2017-12-10 DIAGNOSIS — Z98891 History of uterine scar from previous surgery: Secondary | ICD-10-CM

## 2017-12-10 DIAGNOSIS — Z3A40 40 weeks gestation of pregnancy: Secondary | ICD-10-CM | POA: Diagnosis not present

## 2017-12-10 HISTORY — DX: Herpesviral infection, unspecified: B00.9

## 2017-12-10 LAB — TYPE AND SCREEN
ABO/RH(D): B POS
Antibody Screen: NEGATIVE

## 2017-12-10 LAB — CBC
HCT: 33.3 % — ABNORMAL LOW (ref 36.0–46.0)
HEMOGLOBIN: 10.9 g/dL — AB (ref 12.0–15.0)
MCH: 27.8 pg (ref 26.0–34.0)
MCHC: 32.7 g/dL (ref 30.0–36.0)
MCV: 84.9 fL (ref 78.0–100.0)
Platelets: 296 10*3/uL (ref 150–400)
RBC: 3.92 MIL/uL (ref 3.87–5.11)
RDW: 15.7 % — ABNORMAL HIGH (ref 11.5–15.5)
WBC: 15.6 10*3/uL — ABNORMAL HIGH (ref 4.0–10.5)

## 2017-12-10 LAB — POCT FERN TEST: POCT FERN TEST: POSITIVE

## 2017-12-10 LAB — ABO/RH: ABO/RH(D): B POS

## 2017-12-10 SURGERY — Surgical Case
Anesthesia: Spinal

## 2017-12-10 MED ORDER — IBUPROFEN 600 MG PO TABS
600.0000 mg | ORAL_TABLET | Freq: Four times a day (QID) | ORAL | Status: DC
  Administered 2017-12-10 – 2017-12-13 (×10): 600 mg via ORAL
  Filled 2017-12-10 (×10): qty 1

## 2017-12-10 MED ORDER — ACETAMINOPHEN 325 MG PO TABS: 650.0000 mg | ORAL_TABLET | ORAL | Status: DC | PRN

## 2017-12-10 MED ORDER — BUPIVACAINE HCL (PF) 0.5 % IJ SOLN
INTRAMUSCULAR | Status: AC
  Filled 2017-12-10: qty 30

## 2017-12-10 MED ORDER — BUPIVACAINE IN DEXTROSE 0.75-8.25 % IT SOLN
INTRATHECAL | Status: DC | PRN
  Administered 2017-12-10: 1.6 mL via INTRATHECAL

## 2017-12-10 MED ORDER — PHENYLEPHRINE 8 MG IN D5W 100 ML (0.08MG/ML) PREMIX OPTIME
INJECTION | INTRAVENOUS | Status: DC | PRN
  Administered 2017-12-10: 60 ug/min via INTRAVENOUS

## 2017-12-10 MED ORDER — HYDROMORPHONE HCL 1 MG/ML IJ SOLN
INTRAMUSCULAR | Status: AC
  Filled 2017-12-10: qty 1

## 2017-12-10 MED ORDER — DIPHENHYDRAMINE HCL 50 MG/ML IJ SOLN: 12.5000 mg | INTRAMUSCULAR | Status: DC | PRN

## 2017-12-10 MED ORDER — FENTANYL CITRATE (PF) 100 MCG/2ML IJ SOLN
INTRAMUSCULAR | Status: AC
  Filled 2017-12-10: qty 2

## 2017-12-10 MED ORDER — OXYTOCIN 10 UNIT/ML IJ SOLN
INTRAMUSCULAR | Status: AC
  Filled 2017-12-10: qty 4

## 2017-12-10 MED ORDER — NALOXONE HCL 0.4 MG/ML IJ SOLN: 0.4000 mg | INTRAMUSCULAR | Status: DC | PRN

## 2017-12-10 MED ORDER — NALBUPHINE HCL 10 MG/ML IJ SOLN
5.0000 mg | Freq: Once | INTRAMUSCULAR | Status: AC | PRN
  Administered 2017-12-10: 5 mg via INTRAVENOUS

## 2017-12-10 MED ORDER — COCONUT OIL OIL
1.0000 "application " | TOPICAL_OIL | Status: DC | PRN
  Filled 2017-12-10: qty 120

## 2017-12-10 MED ORDER — METHYLERGONOVINE MALEATE 0.2 MG/ML IJ SOLN
INTRAMUSCULAR | Status: DC | PRN
  Administered 2017-12-10: 0.2 mg via INTRAMUSCULAR

## 2017-12-10 MED ORDER — DEXTROSE 5 % IV SOLN
3.0000 g | Freq: Four times a day (QID) | INTRAVENOUS | Status: AC
  Administered 2017-12-10 (×2): 3 g via INTRAVENOUS
  Filled 2017-12-10: qty 3
  Filled 2017-12-10: qty 3000

## 2017-12-10 MED ORDER — WITCH HAZEL-GLYCERIN EX PADS: 1.0000 "application " | MEDICATED_PAD | CUTANEOUS | Status: DC | PRN

## 2017-12-10 MED ORDER — ZOLPIDEM TARTRATE 5 MG PO TABS: 5.0000 mg | ORAL_TABLET | Freq: Every evening | ORAL | Status: DC | PRN

## 2017-12-10 MED ORDER — MORPHINE SULFATE (PF) 0.5 MG/ML IJ SOLN
INTRAMUSCULAR | Status: DC | PRN
  Administered 2017-12-10: .15 mg via INTRATHECAL

## 2017-12-10 MED ORDER — SCOPOLAMINE 1 MG/3DAYS TD PT72
MEDICATED_PATCH | TRANSDERMAL | Status: AC
  Filled 2017-12-10: qty 1

## 2017-12-10 MED ORDER — OXYTOCIN 40 UNITS IN LACTATED RINGERS INFUSION - SIMPLE MED: 2.5000 [IU]/h | INTRAVENOUS | Status: DC

## 2017-12-10 MED ORDER — HYDROMORPHONE HCL 1 MG/ML IJ SOLN
INTRAMUSCULAR | Status: AC
  Filled 2017-12-10: qty 0.5

## 2017-12-10 MED ORDER — FENTANYL CITRATE (PF) 100 MCG/2ML IJ SOLN: 25.0000 ug | INTRAMUSCULAR | Status: DC | PRN

## 2017-12-10 MED ORDER — OXYCODONE HCL 5 MG/5ML PO SOLN: 5.0000 mg | Freq: Once | ORAL | Status: DC | PRN

## 2017-12-10 MED ORDER — TETANUS-DIPHTH-ACELL PERTUSSIS 5-2.5-18.5 LF-MCG/0.5 IM SUSP: 0.5000 mL | Freq: Once | INTRAMUSCULAR | Status: DC

## 2017-12-10 MED ORDER — ENOXAPARIN SODIUM 80 MG/0.8ML ~~LOC~~ SOLN
70.0000 mg | SUBCUTANEOUS | Status: DC
  Administered 2017-12-11 – 2017-12-13 (×3): 70 mg via SUBCUTANEOUS
  Filled 2017-12-10 (×3): qty 0.8

## 2017-12-10 MED ORDER — SODIUM CHLORIDE 0.9 % IR SOLN
Status: DC | PRN
  Administered 2017-12-10: 1

## 2017-12-10 MED ORDER — MAGNESIUM HYDROXIDE 400 MG/5ML PO SUSP: 30.0000 mL | ORAL | Status: DC | PRN

## 2017-12-10 MED ORDER — NALBUPHINE HCL 10 MG/ML IJ SOLN: 5.0000 mg | INTRAMUSCULAR | Status: DC | PRN

## 2017-12-10 MED ORDER — DIPHENHYDRAMINE HCL 25 MG PO CAPS
25.0000 mg | ORAL_CAPSULE | ORAL | Status: DC | PRN
  Administered 2017-12-11: 25 mg via ORAL
  Filled 2017-12-10: qty 1

## 2017-12-10 MED ORDER — OXYTOCIN 10 UNIT/ML IJ SOLN
INTRAVENOUS | Status: DC | PRN
  Administered 2017-12-10: 40 [IU] via INTRAVENOUS

## 2017-12-10 MED ORDER — OXYTOCIN 10 UNIT/ML IJ SOLN: INTRAVENOUS | Status: DC | PRN

## 2017-12-10 MED ORDER — HYDROMORPHONE HCL 1 MG/ML IJ SOLN
INTRAMUSCULAR | Status: DC | PRN
  Administered 2017-12-10: 1 mg via INTRAVENOUS

## 2017-12-10 MED ORDER — NALBUPHINE HCL 10 MG/ML IJ SOLN: 5.0000 mg | Freq: Once | INTRAMUSCULAR | Status: DC | PRN

## 2017-12-10 MED ORDER — LACTATED RINGERS IV SOLN: INTRAVENOUS | Status: DC

## 2017-12-10 MED ORDER — DIPHENHYDRAMINE HCL 25 MG PO CAPS
25.0000 mg | ORAL_CAPSULE | ORAL | Status: DC | PRN
  Administered 2017-12-11: 25 mg via ORAL
  Filled 2017-12-10 (×2): qty 1

## 2017-12-10 MED ORDER — MEPERIDINE HCL 25 MG/ML IJ SOLN: 6.2500 mg | INTRAMUSCULAR | Status: DC | PRN

## 2017-12-10 MED ORDER — ACETAMINOPHEN 500 MG PO TABS
1000.0000 mg | ORAL_TABLET | Freq: Four times a day (QID) | ORAL | Status: AC
  Administered 2017-12-10 – 2017-12-11 (×4): 1000 mg via ORAL
  Filled 2017-12-10 (×4): qty 2

## 2017-12-10 MED ORDER — ONDANSETRON HCL 4 MG/2ML IJ SOLN: 4.0000 mg | Freq: Three times a day (TID) | INTRAMUSCULAR | Status: DC | PRN

## 2017-12-10 MED ORDER — MORPHINE SULFATE (PF) 0.5 MG/ML IJ SOLN
INTRAMUSCULAR | Status: AC
  Filled 2017-12-10: qty 10

## 2017-12-10 MED ORDER — VALACYCLOVIR HCL 500 MG PO TABS
1000.0000 mg | ORAL_TABLET | Freq: Once | ORAL | Status: AC
  Administered 2017-12-10: 1000 mg via ORAL
  Filled 2017-12-10: qty 2

## 2017-12-10 MED ORDER — OXYTOCIN BOLUS FROM INFUSION: 500.0000 mL | Freq: Once | INTRAVENOUS | Status: DC

## 2017-12-10 MED ORDER — DIPHENHYDRAMINE HCL 25 MG PO CAPS: 25.0000 mg | ORAL_CAPSULE | Freq: Four times a day (QID) | ORAL | Status: DC | PRN

## 2017-12-10 MED ORDER — ONDANSETRON HCL 4 MG/2ML IJ SOLN
4.0000 mg | Freq: Four times a day (QID) | INTRAMUSCULAR | Status: DC | PRN
  Administered 2017-12-10: 4 mg via INTRAVENOUS

## 2017-12-10 MED ORDER — ONDANSETRON HCL 4 MG/2ML IJ SOLN
INTRAMUSCULAR | Status: AC
  Filled 2017-12-10: qty 2

## 2017-12-10 MED ORDER — NALBUPHINE HCL 10 MG/ML IJ SOLN
INTRAMUSCULAR | Status: AC
  Filled 2017-12-10: qty 1

## 2017-12-10 MED ORDER — DEXAMETHASONE SODIUM PHOSPHATE 4 MG/ML IJ SOLN
INTRAMUSCULAR | Status: DC | PRN
  Administered 2017-12-10: 4 mg via INTRAVENOUS

## 2017-12-10 MED ORDER — SIMETHICONE 80 MG PO CHEW
80.0000 mg | CHEWABLE_TABLET | ORAL | Status: DC
  Administered 2017-12-10 – 2017-12-12 (×3): 80 mg via ORAL
  Filled 2017-12-10 (×3): qty 1

## 2017-12-10 MED ORDER — NALBUPHINE HCL 10 MG/ML IJ SOLN: 5.0000 mg | Freq: Once | INTRAMUSCULAR | Status: AC | PRN

## 2017-12-10 MED ORDER — SODIUM CHLORIDE 0.9 % IV SOLN
500.0000 mg | INTRAVENOUS | Status: AC
  Administered 2017-12-10: 500 mg via INTRAVENOUS
  Filled 2017-12-10: qty 500

## 2017-12-10 MED ORDER — OXYCODONE HCL 5 MG PO TABS: 5.0000 mg | ORAL_TABLET | Freq: Once | ORAL | Status: DC | PRN

## 2017-12-10 MED ORDER — SOD CITRATE-CITRIC ACID 500-334 MG/5ML PO SOLN
30.0000 mL | ORAL | Status: DC | PRN
  Administered 2017-12-10: 30 mL via ORAL
  Filled 2017-12-10: qty 15

## 2017-12-10 MED ORDER — VANCOMYCIN HCL IN DEXTROSE 1-5 GM/200ML-% IV SOLN
1000.0000 mg | Freq: Two times a day (BID) | INTRAVENOUS | Status: DC
  Filled 2017-12-10 (×2): qty 200

## 2017-12-10 MED ORDER — DIBUCAINE 1 % RE OINT: 1.0000 "application " | TOPICAL_OINTMENT | RECTAL | Status: DC | PRN

## 2017-12-10 MED ORDER — OXYCODONE-ACETAMINOPHEN 5-325 MG PO TABS: 2.0000 | ORAL_TABLET | ORAL | Status: DC | PRN

## 2017-12-10 MED ORDER — NALOXONE HCL 4 MG/10ML IJ SOLN
1.0000 ug/kg/h | INTRAVENOUS | Status: DC | PRN
  Filled 2017-12-10: qty 5

## 2017-12-10 MED ORDER — LACTATED RINGERS IV SOLN
INTRAVENOUS | Status: DC
  Administered 2017-12-10 (×3): via INTRAVENOUS

## 2017-12-10 MED ORDER — SENNOSIDES-DOCUSATE SODIUM 8.6-50 MG PO TABS
2.0000 | ORAL_TABLET | ORAL | Status: DC
  Administered 2017-12-10 – 2017-12-12 (×3): 2 via ORAL
  Filled 2017-12-10 (×3): qty 2

## 2017-12-10 MED ORDER — FENTANYL CITRATE (PF) 100 MCG/2ML IJ SOLN
INTRAMUSCULAR | Status: DC | PRN
  Administered 2017-12-10: 15 ug via INTRATHECAL

## 2017-12-10 MED ORDER — FLEET ENEMA 7-19 GM/118ML RE ENEM: 1.0000 | ENEMA | RECTAL | Status: DC | PRN

## 2017-12-10 MED ORDER — TERBUTALINE SULFATE 1 MG/ML IJ SOLN: 0.2500 mg | Freq: Once | INTRAMUSCULAR | Status: DC | PRN

## 2017-12-10 MED ORDER — SCOPOLAMINE 1 MG/3DAYS TD PT72
1.0000 | MEDICATED_PATCH | Freq: Once | TRANSDERMAL | Status: DC
  Filled 2017-12-10: qty 1

## 2017-12-10 MED ORDER — METOCLOPRAMIDE HCL 5 MG/ML IJ SOLN
INTRAMUSCULAR | Status: AC
  Filled 2017-12-10: qty 2

## 2017-12-10 MED ORDER — LIDOCAINE HCL (PF) 1 % IJ SOLN: 30.0000 mL | INTRAMUSCULAR | Status: DC | PRN

## 2017-12-10 MED ORDER — SODIUM CHLORIDE 0.9% FLUSH: 3.0000 mL | INTRAVENOUS | Status: DC | PRN

## 2017-12-10 MED ORDER — METOCLOPRAMIDE HCL 5 MG/ML IJ SOLN: 10.0000 mg | Freq: Once | INTRAMUSCULAR | Status: DC | PRN

## 2017-12-10 MED ORDER — METHYLERGONOVINE MALEATE 0.2 MG/ML IJ SOLN
INTRAMUSCULAR | Status: AC
  Filled 2017-12-10: qty 1

## 2017-12-10 MED ORDER — NALOXONE HCL 0.4 MG/ML IJ SOLN
INTRAMUSCULAR | Status: AC
  Filled 2017-12-10: qty 1

## 2017-12-10 MED ORDER — OXYCODONE-ACETAMINOPHEN 5-325 MG PO TABS: 1.0000 | ORAL_TABLET | ORAL | Status: DC | PRN

## 2017-12-10 MED ORDER — LACTATED RINGERS IV SOLN: 500.0000 mL | INTRAVENOUS | Status: DC | PRN

## 2017-12-10 MED ORDER — PROMETHAZINE HCL 25 MG/ML IJ SOLN: 6.2500 mg | INTRAMUSCULAR | Status: DC | PRN

## 2017-12-10 MED ORDER — FERROUS SULFATE 325 (65 FE) MG PO TABS
325.0000 mg | ORAL_TABLET | Freq: Two times a day (BID) | ORAL | Status: DC
  Administered 2017-12-11 – 2017-12-13 (×5): 325 mg via ORAL
  Filled 2017-12-10 (×5): qty 1

## 2017-12-10 MED ORDER — HYDROMORPHONE HCL 1 MG/ML IJ SOLN
0.2500 mg | INTRAMUSCULAR | Status: DC | PRN
  Administered 2017-12-10 (×2): 0.5 mg via INTRAVENOUS

## 2017-12-10 MED ORDER — SIMETHICONE 80 MG PO CHEW: 80.0000 mg | CHEWABLE_TABLET | ORAL | Status: DC | PRN

## 2017-12-10 MED ORDER — KETOROLAC TROMETHAMINE 30 MG/ML IJ SOLN: 30.0000 mg | Freq: Four times a day (QID) | INTRAMUSCULAR | Status: AC | PRN

## 2017-12-10 MED ORDER — PRENATAL MULTIVITAMIN CH
1.0000 | ORAL_TABLET | Freq: Every day | ORAL | Status: DC
  Administered 2017-12-11 – 2017-12-12 (×2): 1 via ORAL
  Filled 2017-12-10 (×2): qty 1

## 2017-12-10 MED ORDER — FENTANYL CITRATE (PF) 100 MCG/2ML IJ SOLN: 100.0000 ug | INTRAMUSCULAR | Status: DC | PRN

## 2017-12-10 MED ORDER — MISOPROSTOL 50MCG HALF TABLET: 50.0000 ug | ORAL_TABLET | ORAL | Status: DC | PRN

## 2017-12-10 MED ORDER — LACTATED RINGERS IV SOLN
INTRAVENOUS | Status: DC
  Administered 2017-12-11: 03:00:00 via INTRAVENOUS

## 2017-12-10 MED ORDER — MEASLES, MUMPS & RUBELLA VAC ~~LOC~~ INJ
0.5000 mL | INJECTION | Freq: Once | SUBCUTANEOUS | Status: DC
  Filled 2017-12-10: qty 0.5

## 2017-12-10 MED ORDER — KETOROLAC TROMETHAMINE 30 MG/ML IJ SOLN: 30.0000 mg | Freq: Once | INTRAMUSCULAR | Status: DC | PRN

## 2017-12-10 MED ORDER — MENTHOL 3 MG MT LOZG: 1.0000 | LOZENGE | OROMUCOSAL | Status: DC | PRN

## 2017-12-10 MED ORDER — SCOPOLAMINE 1 MG/3DAYS TD PT72: 1.0000 | MEDICATED_PATCH | Freq: Once | TRANSDERMAL | Status: DC

## 2017-12-10 MED ORDER — SCOPOLAMINE 1 MG/3DAYS TD PT72
MEDICATED_PATCH | TRANSDERMAL | Status: DC | PRN
  Administered 2017-12-10: 1 via TRANSDERMAL

## 2017-12-10 SURGICAL SUPPLY — 35 items
APL SKNCLS STERI-STRIP NONHPOA (GAUZE/BANDAGES/DRESSINGS) ×1
BENZOIN TINCTURE PRP APPL 2/3 (GAUZE/BANDAGES/DRESSINGS) ×1 IMPLANT
CHLORAPREP W/TINT 26ML (MISCELLANEOUS) ×2 IMPLANT
CLAMP CORD UMBIL (MISCELLANEOUS) IMPLANT
CLOTH BEACON ORANGE TIMEOUT ST (SAFETY) ×2 IMPLANT
DRSG OPSITE POSTOP 4X10 (GAUZE/BANDAGES/DRESSINGS) ×2 IMPLANT
DRSG PAD ABDOMINAL 8X10 ST (GAUZE/BANDAGES/DRESSINGS) ×1 IMPLANT
ELECT REM PT RETURN 9FT ADLT (ELECTROSURGICAL) ×2
ELECTRODE REM PT RTRN 9FT ADLT (ELECTROSURGICAL) ×1 IMPLANT
EXTRACTOR VACUUM M CUP 4 TUBE (SUCTIONS) ×1 IMPLANT
GAUZE SPONGE 4X4 12PLY STRL LF (GAUZE/BANDAGES/DRESSINGS) ×2 IMPLANT
GLOVE BIOGEL PI IND STRL 7.0 (GLOVE) ×3 IMPLANT
GLOVE BIOGEL PI INDICATOR 7.0 (GLOVE) ×3
GLOVE ECLIPSE 7.0 STRL STRAW (GLOVE) ×2 IMPLANT
GOWN STRL REUS W/TWL LRG LVL3 (GOWN DISPOSABLE) ×4 IMPLANT
KIT ABG SYR 3ML LUER SLIP (SYRINGE) IMPLANT
NDL HYPO 25X5/8 SAFETYGLIDE (NEEDLE) ×1 IMPLANT
NEEDLE HYPO 22GX1.5 SAFETY (NEEDLE) ×2 IMPLANT
NEEDLE HYPO 25X5/8 SAFETYGLIDE (NEEDLE) ×2 IMPLANT
NS IRRIG 1000ML POUR BTL (IV SOLUTION) ×2 IMPLANT
PACK C SECTION WH (CUSTOM PROCEDURE TRAY) ×2 IMPLANT
PAD ABD 7.5X8 STRL (GAUZE/BANDAGES/DRESSINGS) ×2 IMPLANT
PAD OB MATERNITY 4.3X12.25 (PERSONAL CARE ITEMS) ×2 IMPLANT
PENCIL SMOKE EVAC W/HOLSTER (ELECTROSURGICAL) ×2 IMPLANT
RTRCTR C-SECT PINK 25CM LRG (MISCELLANEOUS) IMPLANT
STRIP CLOSURE SKIN 1/2X4 (GAUZE/BANDAGES/DRESSINGS) ×1 IMPLANT
SUT PDS AB 0 CTX 36 PDP370T (SUTURE) IMPLANT
SUT PDS AB 0 CTX 60 (SUTURE) ×1 IMPLANT
SUT PLAIN 2 0 XLH (SUTURE) IMPLANT
SUT VIC AB 0 CTX 36 (SUTURE) ×4
SUT VIC AB 0 CTX36XBRD ANBCTRL (SUTURE) ×2 IMPLANT
SUT VIC AB 4-0 KS 27 (SUTURE) ×2 IMPLANT
SYR CONTROL 10ML LL (SYRINGE) ×2 IMPLANT
TOWEL OR 17X24 6PK STRL BLUE (TOWEL DISPOSABLE) ×2 IMPLANT
TRAY FOLEY W/BAG SLVR 14FR LF (SET/KITS/TRAYS/PACK) ×2 IMPLANT

## 2017-12-10 NOTE — Lactation Note (Signed)
This note was copied from a baby's chart. Lactation Consultation Note  Patient Name: Katelyn Bender ZDGLO'VToday's Date: 12/10/2017 Reason for consult: Initial assessment;Term P2, 4 hour female infant, C/S delivery Per parents infant had one wet and one soiled diaper (meconium) past 4 hours of life. Per mom,  she BF her son for 4 months. [facility] [place] Mom was feeding infant on right breast in cradle position, infant latched well no assistance needed by LC.  Latch 10 Mom hand expressed 3 ml of colostrum from left breast that was given to infant. LC discussed I&O BF according hunger cues, 8 to 12 times within 24 hours including nights. LC discussed cluster feedings after 24 hours of life. Mom made aware of O/P services, breastfeeding support groups, community resources, and our phone # for post-discharge questions.   Maternal Data Formula Feeding for Exclusion: No Has patient been taught Hand Expression?: Yes(Mom hand expressed 3 ml colostrum and gave EBM to infant on spoon.) Does the patient have breastfeeding experience prior to this delivery?: Yes  Feeding Feeding Type: Breast Fed Length of feed: 20 min  LATCH Score Latch: Grasps breast easily, tongue down, lips flanged, rhythmical sucking.  Audible Swallowing: Spontaneous and intermittent  Type of Nipple: Everted at rest and after stimulation  Comfort (Breast/Nipple): Soft / non-tender  Hold (Positioning): No assistance needed to correctly position infant at breast.  LATCH Score: 10  Interventions Interventions: Breast feeding basics reviewed;Support pillows;Skin to skin;Expressed milk;Position options;Hand express;Breast massage  Lactation Tools Discussed/Used WIC Program: Yes   Consult Status Consult Status: Follow-up Date: 12/11/17 Follow-up type: In-patient    Katelyn Bender 12/10/2017, 10:39 PM

## 2017-12-10 NOTE — Op Note (Signed)
Katelyn Bender PROCEDURE DATE: 12/10/2017  PREOPERATIVE DIAGNOSES: Intrauterine pregnancy at 1163w5d weeks gestation; recent genital herpes virus infection  POSTOPERATIVE DIAGNOSES: The same  PROCEDURE: Primary  Low Transverse Cesarean Section  SURGEON:  Dr. Jaynie CollinsUgonna Mysha Peeler  ASSISTANT: Dr. Mirian MoPeter Frank, Derrel NipVictor Cresenzo  MSIV  ANESTHESIOLOGY TEAM: Anesthesiologist: Phillips Groutarignan, Peter, MD; Lowella CurbMiller, Warren Ray, MD CRNA: Rhymer, Doree Fudgeolleen S, CRNA  INDICATIONS: Katelyn LeanCynthia Waszak is a 32 y.o. [id number] at 4763w5d here for cesarean section secondary to the indications listed under preoperative diagnoses; please see preoperative note for further details.  The risks of cesarean section were discussed with the patient including but were not limited to: bleeding which may require transfusion or reoperation; infection which may require antibiotics; injury to bowel, bladder, ureters or other surrounding organs; injury to the fetus; need for additional procedures including hysterectomy in the event of a life-threatening hemorrhage; placental abnormalities wth subsequent pregnancies, incisional problems, thromboembolic phenomenon and other postoperative/anesthesia complications.   The patient concurred with the proposed plan, giving informed written consent for the procedure.    FINDINGS:  Viable female infant in cephalic presentation.  Apgars 9 and 9.  Clear amniotic fluid.  Intact placenta, three vessel cord.  Normal uterus and fallopian tubes bilaterally. Surgically absent right ovary; diffusely enlarged left ovary with known ovarian cyst (enlarged to about 7 cm in size).  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  ANESTHESIA: Spinal INTRAVENOUS FLUIDS: 1000 ml   ESTIMATED BLOOD LOSS: 889 ml URINE OUTPUT:  250 ml SPECIMENS: Placenta sent to L&D COMPLICATIONS: None immediate  PROCEDURE IN DETAIL:  The patient preoperatively received intravenous antibiotics and had  sequential compression devices applied to her lower extremities.  She was then taken to the operating room where spinal anesthesia was administered and was found to be adequate. She was then placed in a dorsal supine position with a leftward tilt, and prepped and draped in a sterile manner.  A foley catheter was placed into her bladder and attached to constant gravity.  After an adequate timeout was performed, a Pfannenstiel skin incision was made with scalpel and carried through to the underlying layer of fascia. The fascia was incised in the midline, and this incision was extended bilaterally using the Mayo scissors.  Kocher clamps were applied to the superior aspect of the fascial incision and the underlying rectus muscles were dissected off bluntly.  A similar process was carried out on the inferior aspect of the fascial incision. The rectus muscles were separated in the midline and the peritoneum was entered bluntly. There was a large omental adhesion to anterior abdominal wall which was partly lysed with el;ectrocautery and moved away from area of surgery. The Alexis self-retaining retractor was introduced into the abdominal cavity.  Attention was turned to the lower uterine segment where a low transverse hysterotomy was made with a scalpel and extended bilaterally bluntly.  The infant was successfully delivered with some difficulty due to scar tissue of the surrounding rectus muscles which had to be transected about 1 cm medially on both sides to create adequate exposure.  The cord was clamped and cut after one minute, and the infant was handed over to the awaiting neonatology team. Uterine massage was then administered, and the placenta delivered intact with a three-vessel cord. The uterus was then cleared of clots and debris.  The hysterotomy was closed with 0 Vicryl in a running locked fashion, and an imbricating layer was also placed with 0 Vicryl.  Figure-of-eight 0 Vicryl serosal stitches were  placed to  help with hemostasis.  The pelvis was cleared of all clot and debris. Hemostasis was confirmed on all surfaces.  The retractor was removed.  The peritoneum was closed with a 0 Vicryl running stitch. The fascia was then closed using 0 PDS in a running fashion.  The subcutaneous layer was irrigated, reapproximated with 2-0 plain gut interrupted stitches, and the skin was closed with a 4-0 Vicryl subcuticular stitch. The patient tolerated the procedure well. Sponge, instrument and needle counts were correct x 3.  She was taken to the recovery room in stable condition.    Jaynie Collins, MD, FACOG Obstetrician & Gynecologist, [facility] for [employer], [facility]

## 2017-12-10 NOTE — Anesthesia Postprocedure Evaluation (Signed)
Anesthesia Post Note  Patient: Katelyn LeanCynthia Bender  Procedure(s) Performed: CESAREAN SECTION (N/A )     Patient location during evaluation: PACU Anesthesia Type: Spinal Level of consciousness: oriented and awake and alert Pain management: pain level controlled Vital Signs Assessment: post-procedure vital signs reviewed and stable Respiratory status: spontaneous breathing and respiratory function stable Cardiovascular status: blood pressure returned to baseline and stable Postop Assessment: no headache, no backache and no apparent nausea or vomiting Anesthetic complications: no    Last Vitals:  Vitals:   12/10/17 2000 12/10/17 2023  BP: (!) 97/53 105/70  Pulse: 94 93  Resp: 14 15  Temp: 36.7 C 36.8 C  SpO2: 99% 98%    Last Pain:  Vitals:   12/10/17 2023  TempSrc: Oral  PainSc: 5    Pain Goal:                 Lowella CurbWarren Ray Simra Fiebig

## 2017-12-10 NOTE — Transfer of Care (Addendum)
Immediate Anesthesia Transfer of Care Note  Patient: Katelyn LeanCynthia Coale  Procedure(s) Performed: CESAREAN SECTION (N/A )  Patient Location: PACU  Anesthesia Type:Spinal  Level of Consciousness: awake, alert  and oriented  Airway & Oxygen Therapy: Patient Spontanous Breathing  Post-op Assessment: Report given to RN and Post -op Vital signs reviewed and stable  Post vital signs: Reviewed and stable BP 142/88, HR 100, RR 16, SaO2 100%  Last Vitals:  Vitals Value Taken Time  BP 142/88 12/10/2017  6:50 PM  Temp    Pulse 90 12/10/2017  6:52 PM  Resp    SpO2 100 % 12/10/2017  6:52 PM  Vitals shown include unvalidated device data.  Last Pain:  Vitals:   12/10/17 1606  TempSrc: Oral  PainSc: 0-No pain         Complications: No apparent anesthesia complications

## 2017-12-10 NOTE — Anesthesia Procedure Notes (Signed)
Spinal  Patient location during procedure: OR Staffing Anesthesiologist: Modest Draeger, MD Performed: anesthesiologist  Preanesthetic Checklist Completed: patient identified, site marked, surgical consent, pre-op evaluation, timeout performed, IV checked, risks and benefits discussed and monitors and equipment checked Spinal Block Patient position: sitting Prep: DuraPrep Patient monitoring: heart rate, continuous pulse ox and blood pressure Approach: right paramedian Location: L3-4 Injection technique: single-shot Needle Needle type: Sprotte  Needle gauge: 24 G Needle length: 9 cm Additional Notes Expiration date of kit checked and confirmed. Patient tolerated procedure well, without complications.       

## 2017-12-10 NOTE — Progress Notes (Signed)
Labor Progress Note Werner LeanCynthia Hirsch is a 32 y.o. [phone] 2109G4P1021 at 3689w5d presented for PROM and was found to have a recent HSV outbreak, now scheduled for c-section. S:   O:  BP 103/73   Pulse 82   Temp 98 F (36.7 C) (Oral)   Resp 18   Ht 6' (1.829 m)   Wt (!) 148.3 kg   LMP 02/24/2017   BMI 44.35 kg/m  EFM: 145/moderate var/pos accels/no decels  CVE: Dilation: 2 Effacement (%): 80 Station: -3 Presentation: Vertex Exam by:: ginger morris rn   A&P: 32 y.o. [id number] 2489w5d presented for PROM and was found to have a recent HSV outbreak, now scheduled for c-section. #Labor: scheduled for c-section 8/27pm due to recent HSV outbreak #Pain: per anesthstesia  #FWB: category I #GBS positive, Ancef due to PCN allergy #HSV in PROM: received valtrex prophylaxis #PROM: to receive azithromycin  Mirian MoPeter Maelee Hoot, MD 1:48 PM

## 2017-12-10 NOTE — Anesthesia Preprocedure Evaluation (Addendum)
Anesthesia Evaluation  Patient identified by MRN, date of birth, ID band Patient awake    Reviewed: Allergy & Precautions, NPO status , Patient's Chart, lab work & pertinent test results  Airway Mallampati: II  TM Distance: >3 FB Neck ROM: Full    Dental no notable dental hx.    Pulmonary neg pulmonary ROS,    Pulmonary exam normal breath sounds clear to auscultation       Cardiovascular negative cardio ROS Normal cardiovascular exam Rhythm:Regular Rate:Normal     Neuro/Psych negative neurological ROS  negative psych ROS   GI/Hepatic negative GI ROS, Neg liver ROS,   Endo/Other  Morbid obesity  Renal/GU negative Renal ROS  negative genitourinary   Musculoskeletal negative musculoskeletal ROS (+)   Abdominal   Peds negative pediatric ROS (+)  Hematology negative hematology ROS (+)   Anesthesia Other Findings   Reproductive/Obstetrics (+) Pregnancy                             Anesthesia Physical Anesthesia Plan  ASA: II  Anesthesia Plan: Spinal   Post-op Pain Management:    Induction:   PONV Risk Score and Plan: 2 and Treatment may vary due to age or medical condition  Airway Management Planned: Natural Airway  Additional Equipment:   Intra-op Plan:   Post-operative Plan:   Informed Consent: I have reviewed the patients History and Physical, chart, labs and discussed the procedure including the risks, benefits and alternatives for the proposed anesthesia with the patient or authorized representative who has indicated his/her understanding and acceptance.     Plan Discussed with:   Anesthesia Plan Comments:         Anesthesia Quick Evaluation

## 2017-12-10 NOTE — H&P (Signed)
OBSTETRIC ADMISSION HISTORY AND PHYSICAL  Werner LeanCynthia Berquist is a 32 y.o. female [phone] with IUP at 3028w5d by Early U/S presenting for PROM. PMH significant for genital HSV, obesity and MDD. She reports +FMs, No LOF, no VB, no blurry vision, headaches or peripheral edema, and RUQ pain.  She plans on breast feeding. She request IUD for birth control. [facility]  Dating: By early U/S --->  Estimated Date of Delivery: 12/05/17  Sono:  09/26/2017  @[redacted]w[redacted]d ,  normal anatomy, cephalic presentation, vertex lie, 1403g, 43% EFW   Prenatal History/Complications:  Past Medical History: Past Medical History:  Diagnosis Date  . Medical history non-contributory     Past Surgical History: Past Surgical History:  Procedure Laterality Date  . OVARIAN CYST REMOVAL      Obstetrical History: OB History    Gravida  4   Para  1   Term  1   Preterm      AB  2   Living  1     SAB      TAB  2   Ectopic      Multiple      Live Births  1           Social History: Social History   Socioeconomic History  . Marital status: Single    Spouse name: Not on file  . Number of children: Not on file  . Years of education: Not on file  . Highest education level: Not on file  Occupational History  . Not on file  Social Needs  . Financial resource strain: Not hard at all  . Food insecurity:    Worry: Never true    Inability: Never true  . Transportation needs:    Medical: No    Non-medical: No  Tobacco Use  . Smoking status: Never Smoker  . Smokeless tobacco: Never Used  Substance and Sexual Activity  . Alcohol use: No    Alcohol/week: 0.0 standard drinks  . Drug use: No  . Sexual activity: Yes    Birth control/protection: None  Lifestyle  . Physical activity:    Days per week: 2 days    Minutes per session: 20 min  . Stress: Not at all  Relationships  . Social connections:    Talks on phone: Not on file    Gets together: Not on file   Attends religious service: Not on file    Active member of club or organization: Not on file    Attends meetings of clubs or organizations: Not on file    Relationship status: Not on file  Other Topics Concern  . Not on file  Social History Narrative  . Not on file    Family History: Family History  Problem Relation Age of Onset  . Diabetes Mother   . Hypertension Mother     Allergies: Allergies  Allergen Reactions  . Penicillins     Medications Prior to Admission  Medication Sig Dispense Refill Last Dose  . Cholecalciferol (VITAMIN D) 2000 units CAPS Take 1 capsule (2,000 Units total) by mouth daily before breakfast. 30 capsule 11 Taking  . Prenat-Fe Poly-Methfol-FA-DHA (VITAFOL ULTRA) 29-0.6-0.4-200 MG CAPS Take 1 capsule by mouth daily before breakfast. 90 capsule 4 Taking  . valACYclovir (VALTREX) 1000 MG tablet Take 1 tablet (1,000 mg total) by mouth 2 (two) times daily. Take for 3 days prn each outbreak. 30 tablet 6 Taking     Review of Systems  All systems reviewed and negative except as stated in HPI  Blood pressure 114/67, pulse (!) 101, temperature 98.2 F (36.8 C), resp. rate 16, last menstrual period 02/24/2017. General appearance: alert, cooperative, appears stated age and no distress Lungs: clear to auscultation bilaterally Heart: regular rate and rhythm Abdomen: soft, non-tender; bowel sounds normal Extremities: Homans sign is negative, no sign of DVT Presentation: cephalic Fetal monitoringBaseline: 150 bpm, Variability: Good {> 6 bpm), Accelerations: Reactive and Decelerations: Absent Uterine activityNone Dilation: 2 Effacement (%): 80 Station: -3 Exam by:: ginger morris rn   Prenatal labs: ABO, Rh: B/Positive/-- (02/06 1643) Antibody: Negative (02/06 1643) Rubella: 2.41 (02/06 1643) RPR: Non Reactive (06/03 1120)  HBsAg: Negative (02/06 1643)  HIV: Non Reactive (06/03 1120)  GBS: Positive (07/23 1455)  2 hour: 78(fasting)/79(1  hour)/78(2hour) Genetic screening  Did not see in chart review. Pt unsure Anatomy US normal  Prenatal Transfer Tool  Maternal Diabetes: No Genetic Screening: unclear from chart review, pt unsure Maternal Ultrasounds/Referrals: Normal Fetal Ultrasounds or other Referrals:  Fetal echo Maternal Substance Abuse:  No Significant Maternal Medications:  Meds include: Other: valtrex Significant Maternal Lab Results: Lab values include: Group B Strep positive, Other: HSV positive  Results for orders placed or performed during the hospital encounter of 12/10/17 (from the past 24 hour(s))  [facility] Time: 12/10/17  8:31 AM  Result Value Ref Range   POCT Fern Test Positive = ruptured amniotic membanes     Patient Active Problem List   Diagnosis Date Noted  . Post-dates pregnancy 12/10/2017  . Encounter for fetal anatomic survey   . [redacted] weeks gestation of pregnancy   . Obesity (BMI 35.0-39.9 without comorbidity) 05/29/2017  . Obesity affecting pregnancy in second trimester 05/29/2017  . Supervision of normal pregnancy, antepartum 05/13/2017  . Hx of ovarian cyst 10/15/2014    Assessment/Plan:  Kebra Lowrimore is a 32 y.o. G4P1021 at [redacted]w[redacted]d here for C-section due to PROM with recent HSV outbreak.  #Labor:C-section 8/17 pm #Pain: Per anesthesia #FWB: Category I #ID: BGS(+), HSV(+) pt recieving valtrex, Ancef and azithromycin due to ROM #MOF: breast #MOC:Mirena #Circ:  n/a  Mirian Mo, MD  12/10/2017, 8:46 AM

## 2017-12-10 NOTE — MAU Note (Signed)
Pt presents to MAU with complaints of ROM at 650 this morning. Clear fluid. Denies any pain at present. Induction scheduled for 12/12/17

## 2017-12-11 ENCOUNTER — Encounter (HOSPITAL_COMMUNITY): Payer: Self-pay | Admitting: *Deleted

## 2017-12-11 ENCOUNTER — Encounter: Payer: Medicaid Other | Admitting: Family Medicine

## 2017-12-11 LAB — CBC
HEMATOCRIT: 31.1 % — AB (ref 36.0–46.0)
Hemoglobin: 10.4 g/dL — ABNORMAL LOW (ref 12.0–15.0)
MCH: 28.3 pg (ref 26.0–34.0)
MCHC: 33.4 g/dL (ref 30.0–36.0)
MCV: 84.5 fL (ref 78.0–100.0)
PLATELETS: 299 10*3/uL (ref 150–400)
RBC: 3.68 MIL/uL — ABNORMAL LOW (ref 3.87–5.11)
RDW: 15.5 % (ref 11.5–15.5)
WBC: 24.3 10*3/uL — ABNORMAL HIGH (ref 4.0–10.5)

## 2017-12-11 LAB — CREATININE, SERUM: CREATININE: 0.5 mg/dL (ref 0.44–1.00)

## 2017-12-11 LAB — RPR: RPR Ser Ql: NONREACTIVE

## 2017-12-11 MED ORDER — HYDROXYZINE HCL 25 MG PO TABS
25.0000 mg | ORAL_TABLET | Freq: Three times a day (TID) | ORAL | Status: DC | PRN
  Filled 2017-12-11: qty 1

## 2017-12-11 MED ORDER — NALOXONE HCL 0.4 MG/ML IJ SOLN
INTRAMUSCULAR | Status: AC
  Filled 2017-12-11: qty 1

## 2017-12-11 NOTE — Progress Notes (Signed)
Subjective: Postpartum Day 1: Cesarean Delivery Patient reports doing well overall. She endorses a very bad generalized itch that began just after surgery. She has received benadryl but does not think it is helping.  She denies headaches, changes in vision, RUQ pain, chest pain, shortness of breath, or excessive swelling in her legs. She reports ambulating normally. Her foley catheter remains in place. She has not passed flatus since surgery. She is breastfeeding but plans on incorporating bottle feeding as well. She plans for Mirena for contraception.    Objective: Vital signs in last 24 hours: Temp:  [97.7 F (36.5 C)-98.9 F (37.2 C)] 98.5 F (36.9 C) (08/28 0512) Pulse Rate:  [68-120] 68 (08/28 0512) Resp:  [13-23] 16 (08/28 0512) BP: (82-142)/(43-97) 101/58 (08/28 0512) SpO2:  [96 %-100 %] 98 % (08/28 0512) Weight:  [148.3 kg] 148.3 kg (08/27 0927)  Physical Exam:  General: alert and no distress Lochia: appropriate Uterine Fundus: firm Incision: no significant drainage DVT Evaluation: No evidence of DVT seen on physical exam.  Recent Labs    12/10/17 0850 12/11/17 0523  HGB 10.9* 10.4*  HCT 33.3* 31.1*    Assessment/Plan: Katelyn Bender is a 32 year-old G4P1021 who is POD #1 from pLTCS for HSV. She is doing well postoperatively and is in good condition. Hgb is 10.4 from 10.9. Consider options for pruritis if her itching does not improve. Plan for possible discharge tomorrow.  Natasha MeadBryan A Eiley Mcginnity, MS3 12/11/2017, 7:44 AM

## 2017-12-11 NOTE — Plan of Care (Signed)
Progressing appropriately. Encouraged to call for assistance as needed, and for LATCH assessment.  

## 2017-12-12 ENCOUNTER — Inpatient Hospital Stay (HOSPITAL_COMMUNITY): Admission: RE | Admit: 2017-12-12 | Payer: Medicaid Other | Source: Ambulatory Visit

## 2017-12-12 ENCOUNTER — Encounter (HOSPITAL_COMMUNITY): Payer: Self-pay | Admitting: Obstetrics & Gynecology

## 2017-12-12 NOTE — Plan of Care (Signed)
Progressing appropriately. Encouraged to call for assistance as needed, and for LATCH assessment.  

## 2017-12-12 NOTE — Lactation Note (Signed)
This note was copied from a baby's chart. Lactation Consultation Note  Patient Name: Katelyn Bender NUUVO'ZToday's Date: 12/12/2017 Reason for consult: Follow-up assessment;Term P2, 8631 hour old female infant Per mom, infant had 3 wet and 2 soiled diapers in past 24 hours. Infant has been feeding frequently thought out the day. [facility]. [facility]. LC notice pacifier on bed beside mom. LC discussed possible risk w/ pacifier usage such as nipple confusion, missed feeds from non-nutritive  sucking and possible decrease milk volume. Mom feels BF is going well.  LC reinforced I&O. Discussed breastfeeding engorgement and prevention. Mom made aware of O/P services, breastfeeding support groups, community resources, and our phone # for post-discharge questions.  Maternal Data    Feeding Feeding Type: Breast Fed Length of feed: 30 min  LATCH Score                   Interventions    Lactation Tools Discussed/Used     Consult Status      Danelle EarthlyRobin Krina Mraz 12/12/2017, 1:31 AM

## 2017-12-12 NOTE — Progress Notes (Signed)
POSTPARTUM PROGRESS NOTE  POD #2  Subjective:  Katelyn Bender is a 32 y.o. [id number] s/p pLTCS at 5555w0d for HSV.  She reports she doing well. No acute events overnight.  She denies any problems with ambulating, voiding or po intake. Denies nausea or vomiting. She has  passed flatus. Pain is well controlled.  Lochia is mild.  Objective: Blood pressure 108/69, pulse 61, temperature 97.8 F (36.6 C), temperature source Oral, resp. rate 18, height 6' (1.829 m), weight (!) 148.3 kg, last menstrual period 02/24/2017, SpO2 98 %.  Physical Exam:  General: alert, cooperative and no distress Chest: no respiratory distress Heart:regular rate, distal pulses intact Abdomen: soft, nontender,  Uterine Fundus: firm, appropriately tender DVT Evaluation: No calf swelling or tenderness Skin: warm, dry; incision clean/dry/intact   Recent Labs    12/10/17 0850 12/11/17 0523  HGB 10.9* 10.4*  HCT 33.3* 31.1*    Assessment/Plan: Katelyn Bender is a 32 y.o. [id number] s/p pLTCS at 8655w0d for HSV.  POD#2 - Doing welll; pain well controlled. H/H appropriate  Routine postpartum care  OOB, ambulated  Lovenox for VTE prophylaxis Contraception: IUD Feeding: both  Dispo: Plan for discharge tomorrow.    LOS: 2 days   Marcy Sirenatherine Cecillia Menees, D.O. OB Fellow  12/12/2017, 8:42 AM

## 2017-12-13 ENCOUNTER — Encounter (HOSPITAL_COMMUNITY): Payer: Self-pay | Admitting: *Deleted

## 2017-12-13 MED ORDER — OXYCODONE-ACETAMINOPHEN 5-325 MG PO TABS: 1.0000 | ORAL_TABLET | ORAL | 0 refills | Status: DC | PRN

## 2017-12-13 MED ORDER — IBUPROFEN 600 MG PO TABS: 600.0000 mg | ORAL_TABLET | Freq: Four times a day (QID) | ORAL | 0 refills | Status: DC | PRN

## 2017-12-13 NOTE — Discharge Instructions (Signed)

## 2017-12-13 NOTE — Discharge Summary (Signed)
OB Discharge Summary     Patient Name: Katelyn LeanCynthia Beery DOB: 05/02/1985 MRN: [id number]  Date of admission: 12/10/2017 Delivering MD: Jaynie CollinsANYANWU, UGONNA A   Date of discharge: 12/13/2017  Admitting diagnosis: 40WKS,WATER BROKE Intrauterine pregnancy: 5763w1d     Secondary diagnosis:  Principal Problem:   S/P cesarean section Active Problems:   Hx of ovarian cyst   Post-dates pregnancy   Recurrent genital HSV (herpes simplex virus) infection  Additional problems: PROM     Discharge diagnosis: Term Pregnancy Delivered                                                                                                Post partum procedures:  Augmentation:   Complications: None  Hospital course:  Onset of Labor With Unplanned C/S  32 y.o. yo [id number] at 1563w1d was admitted w/PROM on 12/10/2017.  Membrane Rupture Time/Date: 6:50 AM ,12/10/2017   The patient went for cesarean section due to active HSV, and delivered a Viable infant,12/10/2017  Details of operation can be found in separate operative note. Patient had an uncomplicated postpartum course.  She is ambulating,tolerating a regular diet, passing flatus, and urinating well.  Patient is discharged home in stable condition 12/13/17.  Physical exam  Vitals:   12/12/17 2300 12/12/17 2315 12/12/17 2344 12/13/17 0510  BP: (!) 141/71 (!) 141/71 104/67 (!) 103/57  Pulse: 80 73 71 (!) 56  Resp: 16   18  Temp: 98.7 F (37.1 C) 98.7 F (37.1 C)  97.9 F (36.6 C)  TempSrc: Oral Oral  Oral  SpO2: 100% 100%    Weight:      Height:       General: alert, cooperative and no distress Lochia: appropriate Uterine Fundus: firm Incision: Healing well with no significant drainage, No significant erythema, Dressing is clean, dry, and intact DVT Evaluation: No evidence of DVT seen on physical exam. Negative Homan's sign. No cords or calf tenderness. Labs: Lab Results  Component Value Date   WBC 24.3 (H) 12/11/2017   HGB 10.4 (L) 12/11/2017   HCT 31.1 (L) 12/11/2017   MCV 84.5 12/11/2017   PLT 299 12/11/2017   CMP Latest Ref Rng & Units 12/11/2017  Glucose 70 - 99 mg/dL -  BUN 6 - 23 mg/dL -  Creatinine 4.690.44 - 6.291.00 mg/dL 5.280.50  Sodium 413135 - 244145 mEq/L -  Potassium 3.5 - 5.3 mEq/L -  Chloride 96 - 112 mEq/L -  CO2 19 - 32 mEq/L -  Calcium 8.4 - 10.5 mg/dL -  Total Protein 6.0 - 8.3 g/dL -  Total Bilirubin 0.2 - 1.2 mg/dL -  Alkaline Phos 39 - 010117 U/L -  AST 0 - 37 U/L -  ALT 0 - 35 U/L -    Discharge instruction: per After Visit Summary and "Baby and Me Booklet".  After visit meds:  Allergies as of 12/13/2017      Reactions   Penicillins    Has patient had a PCN reaction causing immediate rash, facial/tongue/throat swelling, SOB or lightheadedness with hypotension: Yes Has patient had a PCN reaction causing severe rash involving  mucus membranes or skin necrosis: No Has patient had a PCN reaction that required hospitalization: No Has patient had a PCN reaction occurring within the last 10 years: No If all of the above answers are "NO", then may proceed with Cephalosporin use.      Medication List    STOP taking these medications   valACYclovir 1000 MG tablet Commonly known as:  VALTREX   VITAFOL ULTRA 29-0.6-0.4-200 MG Caps   Vitamin D 2000 units Caps     TAKE these medications   ibuprofen 600 MG tablet Commonly known as:  ADVIL,MOTRIN Take 1 tablet (600 mg total) by mouth every 6 (six) hours as needed.   oxyCODONE-acetaminophen 5-325 MG tablet Commonly known as:  PERCOCET/ROXICET Take 1 tablet by mouth every 4 (four) hours as needed (pain scale 4-7).       Diet: routine diet  Activity: Advance as tolerated. Pelvic rest for 6 weeks.   Outpatient follow up:1 week Follow up Appt:No future appointments. Follow up Visit:No follow-ups on file.  Postpartum contraception: IUD Mirena  Newborn Data: Live born female  Birth Weight: 7 lb 7.6 oz (3391 g) APGAR: 9, 9  Newborn Delivery   Birth  date/time:  12/10/2017 17:50:00 Delivery type:  C-Section, Vacuum Assisted Trial of labor:  No C-section categorization:  Primary     Baby Feeding: Bottle and Breast Disposition:home with mother   12/13/2017 Jacklyn Shell, CNM

## 2017-12-13 NOTE — Lactation Note (Signed)
This note was copied from a baby's chart. Lactation Consultation Note; Experienced BF mom reports baby is nursing well but her nipples are sore. Small cracks noted at base of nipples. Has been rubbing EBM into nipples and using coconut oil Comfort gels given with instructions for use. Will put them on when she put bra on. Does not have pump for home. Manual pump given with #27 flanges, Reviewed setup, use and cleaning of pump pieces. Reviewed engorgement prevention and treatment. Reports breasts are feeling fuller this morning. Reviewed our phone number, Op appointmenvi and BFSG as resources for support after DC. No questions at present. To call prn  Patient Name: Katelyn Werner LeanCynthia Scheffel WUJWJ'XToday's Date: 12/13/2017 Reason for consult: Follow-up assessment   Maternal Data Formula Feeding for Exclusion: No Has patient been taught Hand Expression?: Yes Does the patient have breastfeeding experience prior to this delivery?: Yes  Feeding Feeding Type: Breast Fed Length of feed: 5 min  LATCH Score       Type of Nipple: Everted at rest and after stimulation  Comfort (Breast/Nipple): Filling, red/small blisters or bruises, mild/mod discomfort        Interventions    Lactation Tools Discussed/Used WIC Program: Yes Pump Review: Setup, frequency, and cleaning Initiated by:: DW Date initiated:: 12/13/17   Consult Status Consult Status: Complete    Katelyn Bender, Katelyn Bender 12/13/2017, 11:05 AM

## 2017-12-23 ENCOUNTER — Ambulatory Visit (INDEPENDENT_AMBULATORY_CARE_PROVIDER_SITE_OTHER): Payer: Medicaid Other | Admitting: Obstetrics

## 2017-12-23 ENCOUNTER — Encounter: Payer: Self-pay | Admitting: Obstetrics

## 2017-12-23 DIAGNOSIS — Z8659 Personal history of other mental and behavioral disorders: Secondary | ICD-10-CM

## 2017-12-23 DIAGNOSIS — E669 Obesity, unspecified: Secondary | ICD-10-CM

## 2017-12-23 DIAGNOSIS — Z8619 Personal history of other infectious and parasitic diseases: Secondary | ICD-10-CM

## 2017-12-23 NOTE — Progress Notes (Signed)
2 weeks PP presents for Incision Check.  She is not having any problems with the incision.

## 2017-12-23 NOTE — Patient Instructions (Signed)
Contraception Choices Contraception, also called birth control, refers to methods or devices that prevent pregnancy. Hormonal methods Contraceptive implant A contraceptive implant is a thin, plastic tube that contains a hormone. It is inserted into the upper part of the arm. It can remain in place for up to 3 years. Progestin-only injections Progestin-only injections are injections of progestin, a synthetic form of the hormone progesterone. They are given every 3 months by a health care provider. Birth control pills Birth control pills are pills that contain hormones that prevent pregnancy. They must be taken once a day, preferably at the same time each day. Birth control patch The birth control patch contains hormones that prevent pregnancy. It is placed on the skin and must be changed once a week for three weeks and removed on the fourth week. A prescription is needed to use this method of contraception. Vaginal ring A vaginal ring contains hormones that prevent pregnancy. It is placed in the vagina for three weeks and removed on the fourth week. After that, the process is repeated with a new ring. A prescription is needed to use this method of contraception. Emergency contraceptive Emergency contraceptives prevent pregnancy after unprotected sex. They come in pill form and can be taken up to 5 days after sex. They work best the sooner they are taken after having sex. Most emergency contraceptives are available without a prescription. This method should not be used as your only form of birth control. Barrier methods Female condom A female condom is a thin sheath that is worn over the penis during sex. Condoms keep sperm from going inside a woman's body. They can be used with a spermicide to increase their effectiveness. They should be disposed after a single use. Female condom A female condom is a soft, loose-fitting sheath that is put into the vagina before sex. The condom keeps sperm from going  inside a woman's body. They should be disposed after a single use. Diaphragm A diaphragm is a soft, dome-shaped barrier. It is inserted into the vagina before sex, along with a spermicide. The diaphragm blocks sperm from entering the uterus, and the spermicide kills sperm. A diaphragm should be left in the vagina for 6-8 hours after sex and removed within 24 hours. A diaphragm is prescribed and fitted by a health care provider. A diaphragm should be replaced every 1-2 years, after giving birth, after gaining more than 15 lb (6.8 kg), and after pelvic surgery. Cervical cap A cervical cap is a round, soft latex or plastic cup that fits over the cervix. It is inserted into the vagina before sex, along with spermicide. It blocks sperm from entering the uterus. The cap should be left in place for 6-8 hours after sex and removed within 48 hours. A cervical cap must be prescribed and fitted by a health care provider. It should be replaced every 2 years. Sponge A sponge is a soft, circular piece of polyurethane foam with spermicide on it. The sponge helps block sperm from entering the uterus, and the spermicide kills sperm. To use it, you make it wet and then insert it into the vagina. It should be inserted before sex, left in for at least 6 hours after sex, and removed and thrown away within 30 hours. Spermicides Spermicides are chemicals that kill or block sperm from entering the cervix and uterus. They can come as a cream, jelly, suppository, foam, or tablet. A spermicide should be inserted into the vagina with an applicator at least 10-15 minutes before   sex to allow time for it to work. The process must be repeated every time you have sex. Spermicides do not require a prescription. Intrauterine contraception Intrauterine device (IUD) An IUD is a T-shaped device that is put in a woman's uterus. There are two types:  Hormone IUD.This type contains progestin, a synthetic form of the hormone progesterone. This  type can stay in place for 3-5 years.  Copper IUD.This type is wrapped in copper wire. It can stay in place for 10 years.  Permanent methods of contraception Female tubal ligation In this method, a woman's fallopian tubes are sealed, tied, or blocked during surgery to prevent eggs from traveling to the uterus. Hysteroscopic sterilization In this method, a small, flexible insert is placed into each fallopian tube. The inserts cause scar tissue to form in the fallopian tubes and block them, so sperm cannot reach an egg. The procedure takes about 3 months to be effective. Another form of birth control must be used during those 3 months. Female sterilization This is a procedure to tie off the tubes that carry sperm (vasectomy). After the procedure, the man can still ejaculate fluid (semen). Natural planning methods Natural family planning In this method, a couple does not have sex on days when the woman could become pregnant. Calendar method This means keeping track of the length of each menstrual cycle, identifying the days when pregnancy can happen, and not having sex on those days. Ovulation method In this method, a couple avoids sex during ovulation. Symptothermal method This method involves not having sex during ovulation. The woman typically checks for ovulation by watching changes in her temperature and in the consistency of cervical mucus. Post-ovulation method In this method, a couple waits to have sex until after ovulation. Summary  Contraception, also called birth control, means methods or devices that prevent pregnancy.  Hormonal methods of contraception include implants, injections, pills, patches, vaginal rings, and emergency contraceptives.  Barrier methods of contraception can include female condoms, female condoms, diaphragms, cervical caps, sponges, and spermicides.  There are two types of IUDs (intrauterine devices). An IUD can be put in a woman's uterus to prevent pregnancy  for 3-5 years.  Permanent sterilization can be done through a procedure for males, females, or both.  Natural family planning methods involve not having sex on days when the woman could become pregnant. This information is not intended to replace advice given to you by your health care provider. Make sure you discuss any questions you have with your health care provider. Document Released: 04/02/2005 Document Revised: 05/05/2016 Document Reviewed: 05/05/2016 Elsevier Interactive Patient Education  2018 [employer]  

## 2017-12-23 NOTE — Progress Notes (Signed)
Subjective:     Katelyn Bender is a 32 y.o. female who presents for a postpartum visit. She is 2 weeks postpartum following a low cervical transverse Cesarean section. I have fully reviewed the prenatal and intrapartum course. The delivery was at 40 gestational weeks. Outcome: primary cesarean section, low transverse incision. Anesthesia: spinal. Postpartum course has been normal. Baby's course has been normal. Baby is feeding by breast. Bleeding thin lochia. Bowel function is normal. Bladder function is normal. Patient is not sexually active. Contraception method is abstinence. Postpartum depression screening: negative.  Tobacco, alcohol and substance abuse history reviewed.  Adult immunizations reviewed including TDAP, rubella and varicella.  The following portions of the patient's history were reviewed and updated as appropriate: allergies, current medications, past family history, past medical history, past social history, past surgical history and problem list.  Review of Systems A comprehensive review of systems was negative.   Objective:    BP 105/76   Pulse 68   Ht 6' (1.829 m)   Wt (!) 309 lb 4.8 oz (140.3 kg)   Breastfeeding? Yes   BMI 41.95 kg/m   General:  alert and no distress   Breasts:  inspection negative, no nipple discharge or bleeding, no masses or nodularity palpable  Lungs: clear to auscultation bilaterally  Heart:  regular rate and rhythm, S1, S2 normal, no murmur, click, rub or gallop  Abdomen: soft, non-tender; bowel sounds normal; no masses,  no organomegaly and incision is clean, dry and intact    50% of 15 min visit spent on counseling and coordination of care.   Assessment:     1. Postpartum care following cesarean delivery - doing well  2. History of depression - stable  3. History of herpes genitalis  4. Obesity (BMI 35.0-39.9 without comorbidity)   Plan:    1. Contraception: considering options  2. Continue PNV's 3. Follow up in: 3 weeks or  as needed.   Brock Bad MD 12-23-2017

## 2018-01-13 ENCOUNTER — Other Ambulatory Visit: Payer: Self-pay

## 2018-01-13 ENCOUNTER — Ambulatory Visit (INDEPENDENT_AMBULATORY_CARE_PROVIDER_SITE_OTHER): Payer: Medicaid Other | Admitting: Obstetrics

## 2018-01-13 ENCOUNTER — Encounter: Payer: Self-pay | Admitting: Obstetrics

## 2018-01-13 DIAGNOSIS — G8918 Other acute postprocedural pain: Secondary | ICD-10-CM

## 2018-01-13 DIAGNOSIS — Z3202 Encounter for pregnancy test, result negative: Secondary | ICD-10-CM

## 2018-01-13 DIAGNOSIS — Z30017 Encounter for initial prescription of implantable subdermal contraceptive: Secondary | ICD-10-CM

## 2018-01-13 DIAGNOSIS — Z3009 Encounter for other general counseling and advice on contraception: Secondary | ICD-10-CM

## 2018-01-13 DIAGNOSIS — Z3689 Encounter for other specified antenatal screening: Secondary | ICD-10-CM

## 2018-01-13 LAB — POCT URINE PREGNANCY: Preg Test, Ur: NEGATIVE

## 2018-01-13 MED ORDER — IBUPROFEN 800 MG PO TABS: 800.0000 mg | ORAL_TABLET | Freq: Three times a day (TID) | ORAL | 5 refills | Status: AC | PRN

## 2018-01-13 NOTE — Progress Notes (Signed)
Subjective:     Katelyn Bender is a 32 y.o. female who presents for a postpartum visit. She is 4 weeks postpartum following a low cervical transverse Cesarean section. I have fully reviewed the prenatal and intrapartum course. The delivery was at 40.5 gestational weeks. Outcome: primary cesarean section, low transverse incision. Anesthesia: spinal. Postpartum course has been Unremarkable. Baby's course has been Unremarkable. Baby is feeding by breast. Bleeding no bleeding. Bowel function is normal. Bladder function is normal. Patient is not sexually active. Contraception method is none. Postpartum depression screening: negative.  The following portions of the patient's history were reviewed and updated as appropriate: allergies, current medications, past family history, past medical history, past social history, past surgical history and problem list.  Review of Systems A comprehensive review of systems was negative.   Objective:    BP (!) 134/95   Pulse 79   Ht 6' (1.829 m)   Wt (!) 303 lb 11.2 oz (137.8 kg)   LMP  (LMP Unknown)   Breastfeeding? Yes   BMI 41.19 kg/m   General:  alert and no distress   Breasts:  inspection negative, no nipple discharge or bleeding, no masses or nodularity palpable  Lungs: clear to auscultation bilaterally  Heart:  regular rate and rhythm, S1, S2 normal, no murmur, click, rub or gallop  Abdomen: soft, non-tender; bowel sounds normal; no masses,  no organomegaly and incision is clean, dry and intact   Assessment:     1. Postpartum care following cesarean delivery Rx: - POCT urine pregnancy  2. Encounter for other general counseling and advice on contraception - wants IUD  3. Encounter for initial prescription of implantable subdermal contraceptive Rx: - POCT urine pregnancy - ParaGuard IUD Rx  4. Postoperative pain Rx: - ibuprofen (ADVIL,MOTRIN) 800 MG tablet; Take 1 tablet (800 mg total) by mouth every 8 (eight) hours as needed.  Dispense: 30  tablet; Refill: 5   Plan:    1. Contraception: IUD 2. Continue PNV's 3. Follow up in: 4 weeks for IUD insertion   Brock Bad MD 01-13-2018

## 2018-02-10 ENCOUNTER — Encounter: Payer: Self-pay | Admitting: Obstetrics

## 2018-02-10 ENCOUNTER — Ambulatory Visit (INDEPENDENT_AMBULATORY_CARE_PROVIDER_SITE_OTHER): Payer: Medicaid Other | Admitting: Obstetrics

## 2018-02-10 VITALS — BP 129/89 | HR 62 | Wt 307.0 lb

## 2018-02-10 DIAGNOSIS — Z30430 Encounter for insertion of intrauterine contraceptive device: Secondary | ICD-10-CM

## 2018-02-10 DIAGNOSIS — Z3202 Encounter for pregnancy test, result negative: Secondary | ICD-10-CM | POA: Diagnosis not present

## 2018-02-10 LAB — POCT URINE PREGNANCY: Preg Test, Ur: NEGATIVE

## 2018-02-10 MED ORDER — PARAGARD INTRAUTERINE COPPER IU IUD
INTRAUTERINE_SYSTEM | Freq: Once | INTRAUTERINE | Status: AC
  Administered 2018-02-10: 1 via INTRAUTERINE

## 2018-02-10 NOTE — Progress Notes (Signed)
RGYN pt presents for IUD Insertion today.  LMP: unsure due to irregular periods currently breastfeeding.   UPT : NEGATIVE   Pt denies  any unprotected intercourse x 14 days.

## 2018-02-10 NOTE — Progress Notes (Signed)
IUD Insertion Procedure Note  Pre-operative Diagnosis: Desires Long-Acting Reversible Contraception ( LARC )  Post-operative Diagnosis: same  Indications: contraception  Procedure Details  Urine pregnancy test was done in office and result was negative.  The risks (including infection, bleeding, pain, and uterine perforation) and benefits of the procedure were explained to the patient and Written informed consent was obtained.    Cervix cleansed with Betadine. Uterus sounded to 7 cm. IUD inserted without difficulty. String visible and trimmed. Patient tolerated procedure well.  IUD Information: ParaGard, Lot # [id number], Expiration date: JUL  2025.  Condition: Stable  Complications: None  Plan:  The patient was advised to call for any fever or for prolonged or severe pain or bleeding. She was advised to use NSAID as needed for mild to moderate pain.   Attending Physician Documentation: I was present for or participated in the entire procedure, including opening and closing.  Brock Bad MD 02-10-2018

## 2018-03-17 ENCOUNTER — Ambulatory Visit (INDEPENDENT_AMBULATORY_CARE_PROVIDER_SITE_OTHER): Payer: Medicaid Other | Admitting: Obstetrics

## 2018-03-17 ENCOUNTER — Encounter: Payer: Self-pay | Admitting: Obstetrics

## 2018-03-17 VITALS — BP 137/88 | HR 76 | Wt 312.0 lb

## 2018-03-17 DIAGNOSIS — Z30431 Encounter for routine checking of intrauterine contraceptive device: Secondary | ICD-10-CM

## 2018-03-17 DIAGNOSIS — Z975 Presence of (intrauterine) contraceptive device: Secondary | ICD-10-CM

## 2018-03-17 NOTE — Progress Notes (Signed)
Subjective:    Katelyn Bender  who presents for IUD string check. The patient has no complaints today. The patient is sexually active. Pertinent past medical history: none.  Likes her ParaGuard IUD.  Is not currently bleeding with the IUD.    The information documented in the HPI was reviewed and verified.  Menstrual History: OB History    Gravida Para Term Preterm AB Living   1 1 1     1    SAB TAB Ectopic Multiple Live Births         0 1      No LMP recorded. has ParaGuard IUD   Patient Active Problem List   Diagnosis Date Noted   Patient Active Problem List   Diagnosis Date Noted  . Post-dates pregnancy 12/10/2017  . Recurrent genital HSV (herpes simplex virus) infection 12/10/2017  . Obesity (BMI 35.0-39.9 without comorbidity) 05/29/2017  . Obesity affecting pregnancy in second trimester 05/29/2017  . S/P cesarean section 05/13/2017  . Hx of ovarian cyst 10/15/2014    No past medical history on file.  Past Surgical History:  Procedure Laterality Date   Past Surgical History:  Procedure Laterality Date  . CESAREAN SECTION N/A 12/10/2017   Procedure: CESAREAN SECTION;  Surgeon: Tereso Newcomer, MD;  Location: [facility];  Service: Obstetrics;  Laterality: N/A;  . OVARIAN CYST REMOVAL        Current Outpatient Prescriptions:  No medication comments found. Current Outpatient Medications on File Prior to Visit  Medication Sig Dispense Refill  . ibuprofen (ADVIL,MOTRIN) 800 MG tablet Take 1 tablet (800 mg total) by mouth every 8 (eight) hours as needed. 30 tablet 5  . oxyCODONE-acetaminophen (PERCOCET/ROXICET) 5-325 MG tablet Take 1 tablet by mouth every 4 (four) hours as needed (pain scale 4-7). (Patient not taking: Reported on 01/13/2018) 30 tablet 0   No current facility-administered medications on file prior to visit.     Allergies  Allergen Reactions   Allergies  Allergen Reactions  . Penicillins     Has patient had a PCN reaction causing immediate  rash, facial/tongue/throat swelling, SOB or lightheadedness with hypotension: Yes Has patient had a PCN reaction causing severe rash involving mucus membranes or skin necrosis: No Has patient had a PCN reaction that required hospitalization: No Has patient had a PCN reaction occurring within the last 10 years: No If all of the above answers are "NO", then may proceed with Cephalosporin use.      Social History  Substance Use Topics   Social History   Socioeconomic History  . Marital status: Single    Spouse name: Not on file  . Number of children: Not on file  . Years of education: Not on file  . Highest education level: Not on file  Occupational History  . Not on file  Social Needs  . Financial resource strain: Not hard at all  . Food insecurity:    Worry: Never true    Inability: Never true  . Transportation needs:    Medical: No    Non-medical: No  Tobacco Use  . Smoking status: Never Smoker  . Smokeless tobacco: Never Used  Substance and Sexual Activity  . Alcohol use: No    Alcohol/week: 0.0 standard drinks  . Drug use: No  . Sexual activity: Yes    Birth control/protection: None  Lifestyle  . Physical activity:    Days per week: 2 days    Minutes per session: 20 min  . Stress: Not at  all  Relationships  . Social connections:    Talks on phone: Not on file    Gets together: Not on file    Attends religious service: Not on file    Active member of club or organization: Not on file    Attends meetings of clubs or organizations: Not on file    Relationship status: Not on file  . Intimate partner violence:    Fear of current or ex partner: No    Emotionally abused: No    Physically abused: No    Forced sexual activity: No  Other Topics Concern  . Not on file  Social History Narrative  . Not on file     No family history on file.     Review of Systems Constitutional: negative for weight loss Genitourinary:negative for abnormal menstrual periods and  vaginal discharge   Objective:   BP 115/77   Pulse 89   Wt 161 lb (73 kg)   BMI 25.22 kg/m    General:   alert  Skin:   no rash or abnormalities  Lungs:   clear to auscultation bilaterally  Heart:   regular rate and rhythm, S1, S2 normal, no murmur, click, rub or gallop  Breasts:   deferred  Abdomen:  normal findings: no organomegaly, soft, non-tender and no hernia  Pelvis:  External genitalia: normal general appearance Urinary system: urethral meatus normal and bladder without fullness, nontender Vaginal: normal without tenderness, induration or masses Cervix: normal appearance, IUD strings present Adnexa: normal bimanual exam Uterus: anteverted and non-tender, normal size   Lab Review Urine pregnancy test Labs reviewed yes Radiologic studies reviewed no  50% of 15 min visit spent on counseling and coordination of care.    Assessment:    32 y.o., continuing IUD, no contraindications.   Plan:    All questions answered.   Follow up in 4 months for Annual.  Brock BadHARLES A. Lyliana Dicenso MD 03-17-2018

## 2018-05-21 ENCOUNTER — Other Ambulatory Visit: Payer: Self-pay

## 2018-05-21 ENCOUNTER — Ambulatory Visit (HOSPITAL_COMMUNITY)
Admission: EM | Admit: 2018-05-21 | Discharge: 2018-05-21 | Disposition: A | Discharge status: Home / Self Care | Payer: Medicaid Other | Attending: Emergency Medicine | Admitting: Emergency Medicine

## 2018-05-21 ENCOUNTER — Encounter (HOSPITAL_COMMUNITY): Payer: Self-pay | Admitting: Emergency Medicine

## 2018-05-21 DIAGNOSIS — J040 Acute laryngitis: Secondary | ICD-10-CM

## 2018-05-21 DIAGNOSIS — B349 Viral infection, unspecified: Secondary | ICD-10-CM | POA: Diagnosis not present

## 2018-05-21 MED ORDER — PREDNISONE 50 MG PO TABS: 50.0000 mg | ORAL_TABLET | Freq: Every day | ORAL | 0 refills | Status: AC

## 2018-05-21 MED ORDER — IPRATROPIUM BROMIDE 0.06 % NA SOLN: 2.0000 | Freq: Four times a day (QID) | NASAL | 0 refills | Status: AC

## 2018-05-21 NOTE — Discharge Instructions (Signed)
Start prednisone as directed. Start atrovent nasal spray for nasal congestion/drainage. You can use over the counter nasal saline rinse such as neti pot for nasal congestion. Keep hydrated, your urine should be clear to pale yellow in color. Tylenol/motrin for fever and pain. Monitor for any worsening of symptoms, chest pain, shortness of breath, wheezing, swelling of the throat, follow up for reevaluation.  ° °For sore throat/cough try using a honey-based tea. Use 3 teaspoons of honey with juice squeezed from half lemon. Place shaved pieces of ginger into 1/2-1 cup of water and warm over stove top. Then mix the ingredients and repeat every 4 hours as needed. °

## 2018-05-21 NOTE — ED Triage Notes (Signed)
Cough since Thursday.  Cough is aggravating.  Complains of nasal congestion, complains of hoarseness.

## 2018-05-21 NOTE — ED Provider Notes (Signed)
[facility]    CSN: [id number] Arrival date & time: 05/21/18  0932     History   Chief Complaint Chief Complaint  Patient presents with  . URI    HPI Werner LeanCynthia Triplett is a 33 y.o. female.   33 year old female comes in for 6-day history of URI symptoms.  Has had rhinorrhea, nasal congestion, postnasal drip, cough.  Denies fever, chills, night sweats.  States has had voice hoarseness that is worsening.  She is a singer, and has a performance in a few days.  She has been trying otc cold medicine, hot tea, ginger, honey with mild relief.  Never smoker.     Past Medical History:  Diagnosis Date  . HSV (herpes simplex virus) infection    Last outbreak was last week of 12/03/17  . Medical history non-contributory     Patient Active Problem List   Diagnosis Date Noted  . Post-dates pregnancy 12/10/2017  . Recurrent genital HSV (herpes simplex virus) infection 12/10/2017  . Obesity (BMI 35.0-39.9 without comorbidity) 05/29/2017  . Obesity affecting pregnancy in second trimester 05/29/2017  . S/P cesarean section 05/13/2017  . Hx of ovarian cyst 10/15/2014    Past Surgical History:  Procedure Laterality Date  . CESAREAN SECTION N/A 12/10/2017   Procedure: CESAREAN SECTION;  Surgeon: Tereso NewcomerAnyanwu, Ugonna A, MD;  Location: [facility];  Service: Obstetrics;  Laterality: N/A;  . OVARIAN CYST REMOVAL      OB History    Gravida  4   Para  1   Term  1   Preterm      AB  2   Living  1     SAB      TAB  2   Ectopic      Multiple      Live Births  1            Home Medications    Prior to Admission medications   Medication Sig Start Date End Date Taking? Authorizing Provider  Prenatal Vit-Fe Fumarate-FA (PRENATAL PO) Take by mouth.   Yes [provider]  ibuprofen (ADVIL,MOTRIN) 800 MG tablet Take 1 tablet (800 mg total) by mouth every 8 (eight) hours as needed. 01/13/18   Brock BadHarper, Charles A, MD  ipratropium (ATROVENT) 0.06 % nasal  spray Place 2 sprays into both nostrils 4 (four) times daily. 05/21/18   Cathie HoopsYu, Lateria Alderman V, PA-C  predniSONE (DELTASONE) 50 MG tablet Take 1 tablet (50 mg total) by mouth daily. 05/21/18   Belinda FisherYu, Ahliya Glatt V, PA-C    Family History Family History  Problem Relation Age of Onset  . Diabetes Mother   . Hypertension Mother     Social History Social History   Tobacco Use  . Smoking status: Never Smoker  . Smokeless tobacco: Never Used  Substance Use Topics  . Alcohol use: No    Alcohol/week: 0.0 standard drinks  . Drug use: No     Allergies   Penicillins   Review of Systems Review of Systems  Reason unable to perform ROS: See HPI as above.     Physical Exam Triage Vital Signs ED Triage Vitals  Enc Vitals Group     BP 05/21/18 1048 108/65     Pulse Rate 05/21/18 1048 88     Resp 05/21/18 1048 18     Temp 05/21/18 1048 98.2 F (36.8 C)     Temp Source 05/21/18 1048 Oral     SpO2 05/21/18 1048 96 %  Weight --      Height --      Head Circumference --      Peak Flow --      Pain Score 05/21/18 1045 0     Pain Loc --      Pain Edu? --      Excl. in GC? --    No data found.  Updated Vital Signs BP 108/65 (BP Location: Right Arm) Comment (BP Location): large cuff  Pulse 88   Temp 98.2 F (36.8 C) (Oral)   Resp 18   LMP 05/12/2018   SpO2 96%   Physical Exam Constitutional:      General: She is not in acute distress.    Appearance: She is well-developed. She is not ill-appearing, toxic-appearing or diaphoretic.  HENT:     Head: Normocephalic and atraumatic.     Right Ear: Tympanic membrane, ear canal and external ear normal. Tympanic membrane is not erythematous or bulging.     Left Ear: Tympanic membrane, ear canal and external ear normal. Tympanic membrane is not erythematous or bulging.     Nose: Nose normal.     Right Sinus: No maxillary sinus tenderness or frontal sinus tenderness.     Left Sinus: No maxillary sinus tenderness or frontal sinus tenderness.      Mouth/Throat:     Mouth: Mucous membranes are moist.     Pharynx: Oropharynx is clear. Uvula midline. No posterior oropharyngeal erythema.     Tonsils: No tonsillar exudate.  Eyes:     Conjunctiva/sclera: Conjunctivae normal.     Pupils: Pupils are equal, round, and reactive to light.  Neck:     Musculoskeletal: Normal range of motion and neck supple.  Cardiovascular:     Rate and Rhythm: Normal rate and regular rhythm.     Heart sounds: Normal heart sounds. No murmur. No friction rub. No gallop.   Pulmonary:     Effort: Pulmonary effort is normal. No respiratory distress.     Breath sounds: Normal breath sounds. No stridor. No decreased breath sounds, wheezing, rhonchi or rales.  Lymphadenopathy:     Cervical: No cervical adenopathy.  Skin:    General: Skin is warm and dry.  Neurological:     Mental Status: She is alert and oriented to person, place, and time.  Psychiatric:        Behavior: Behavior normal.        Judgment: Judgment normal.      UC Treatments / Results  Labs (all labs ordered are listed, but only abnormal results are displayed) Labs Reviewed - No data to display  EKG None  Radiology No results found.  Procedures Procedures (including critical care time)  Medications Ordered in UC Medications - No data to display  Initial Impression / Assessment and Plan / UC Course  I have reviewed the triage vital signs and the nursing notes.  Pertinent labs & imaging results that were available during my care of the patient were reviewed by me and considered in my medical decision making (see chart for details).    Will provide prednisone for laryngitis given profession.  Other symptomatic treatment discussed.  Push fluids.  Return precautions given.  Patient expresses understanding and agrees to plan.  Final Clinical Impressions(s) / UC Diagnoses   Final diagnoses:  Viral illness  Laryngitis    ED Prescriptions    Medication Sig Dispense Auth. Provider    predniSONE (DELTASONE) 50 MG tablet Take 1 tablet (50 mg total) by  mouth daily. 5 tablet Vernice Mannina V, PA-C   ipratropium (ATROVENT) 0.06 % nasal spray Place 2 sprays into both nostrils 4 (four) times daily. 15 mL Threasa Alpha, [place] 05/21/18 1152

## 2018-06-07 IMAGING — US US OB COMP LESS 14 WK
1 series · 15 of 16 positions shown · non-contrast
Comparison: Pelvic ultrasound October 21, 2014

ADDENDUM:
In the body of the report there is a typographical error that
indicates that the gestational age is 90 weeks, 5 days. In fact the
should be 9 weeks, 5 days.
CLINICAL DATA: Pregnancy, unknown location, assess viability.
History of a suspected dermoid.

EXAM:
OBSTETRIC <14 WK ULTRASOUND
TECHNIQUE: Transabdominal ultrasound was performed for evaluation of the
gestation as well as the maternal uterus and adnexal regions.

[Series 1: us ob comp less 14 wk · 16 acquisitions, 15 frames shown]
[im 1/16]
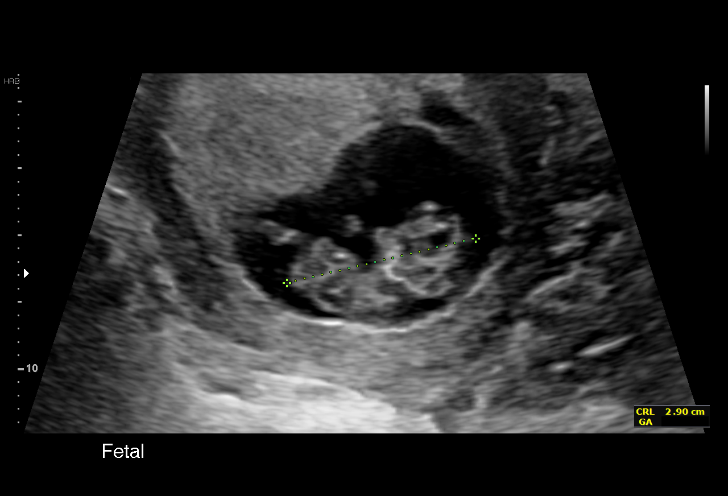
[im 2/16]
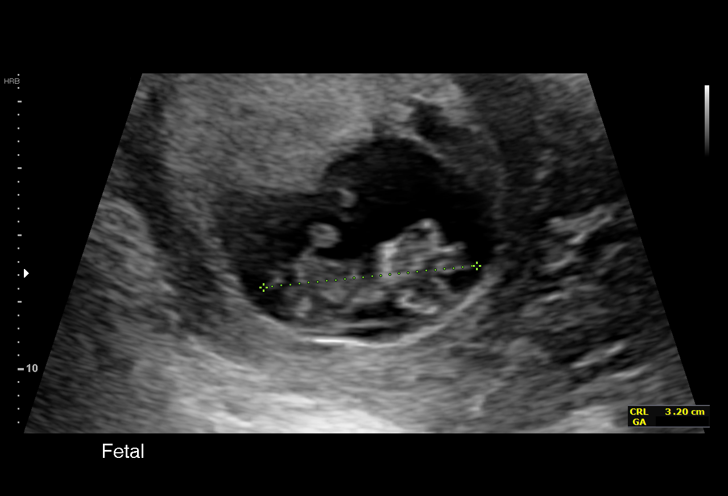
[im 3/16]
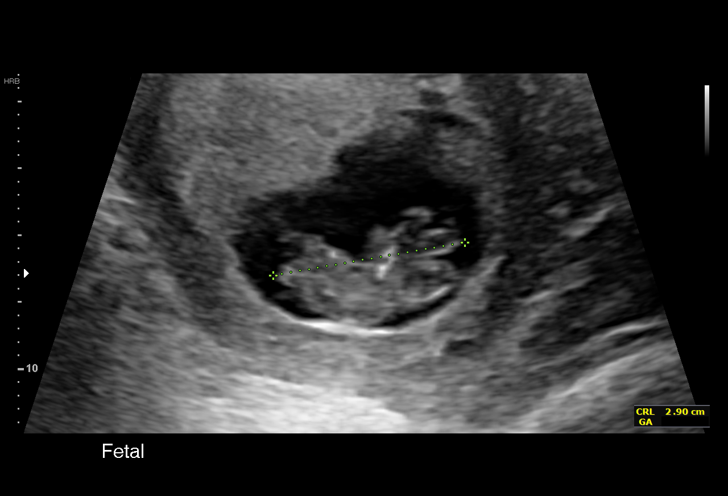
[im 4/16]
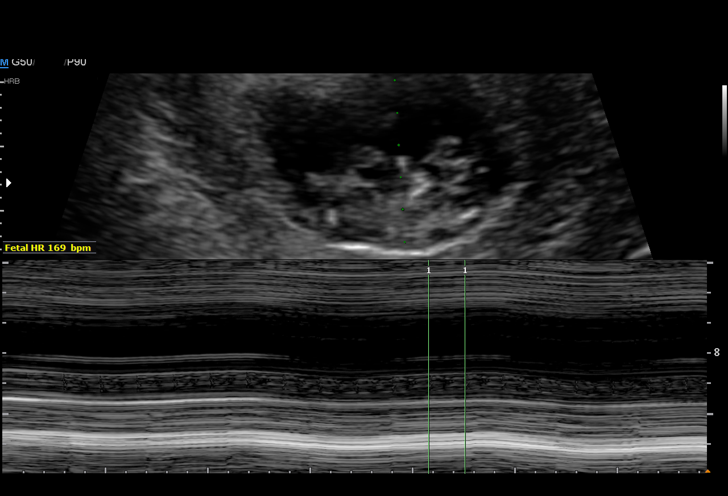
[im 5/16]
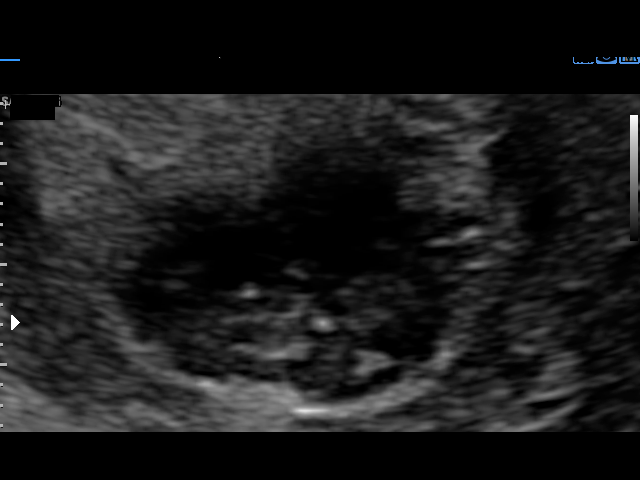
[im 6/16]
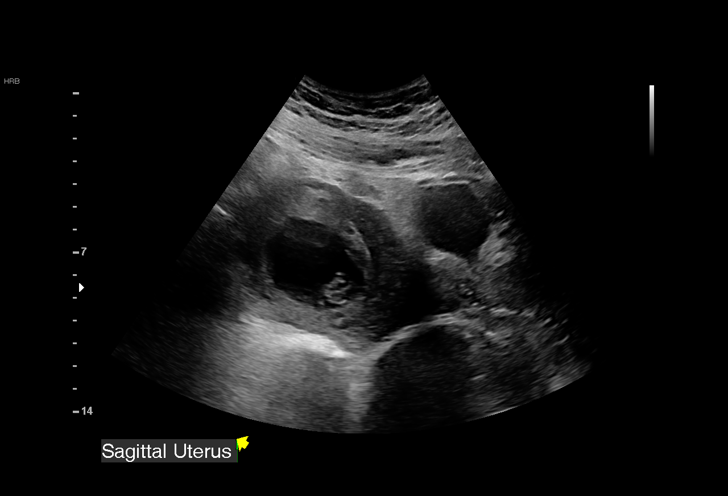
[im 7/16]
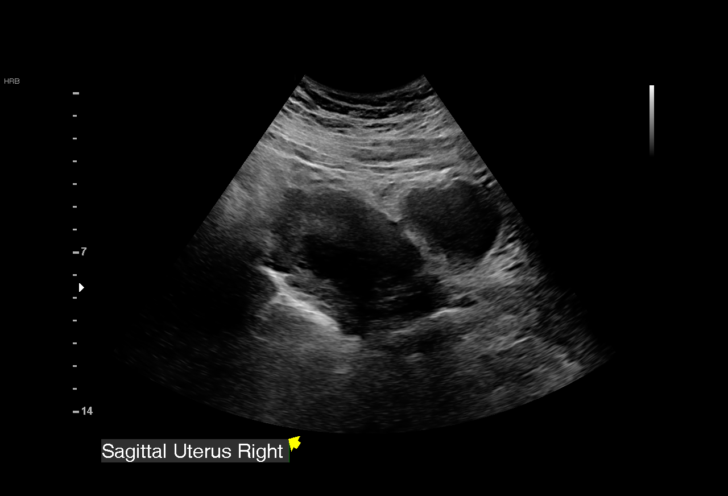
[im 9/16]
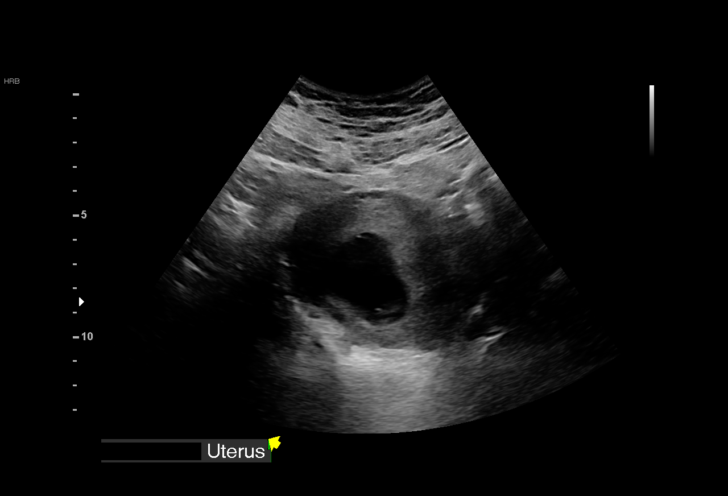
[im 10/16]
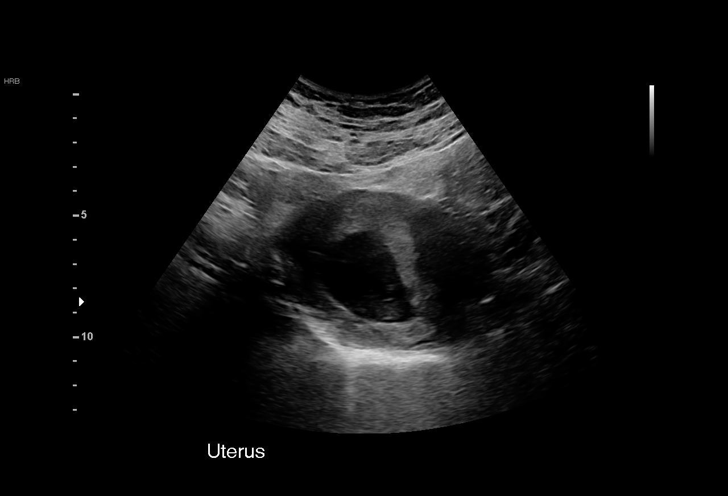
[im 11/16]
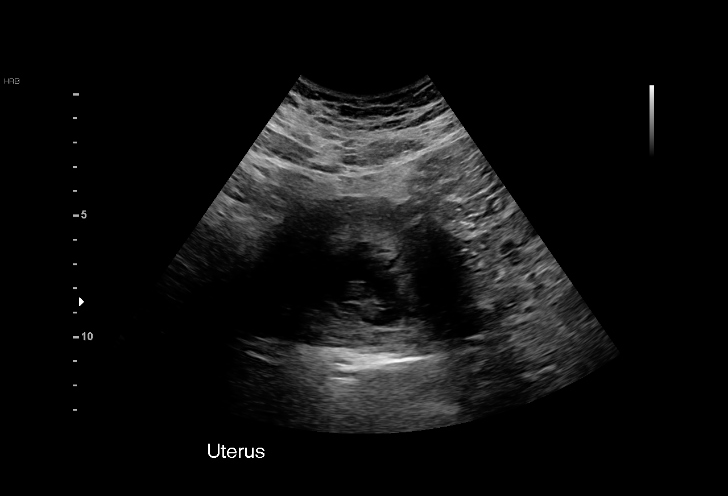
[im 12/16]
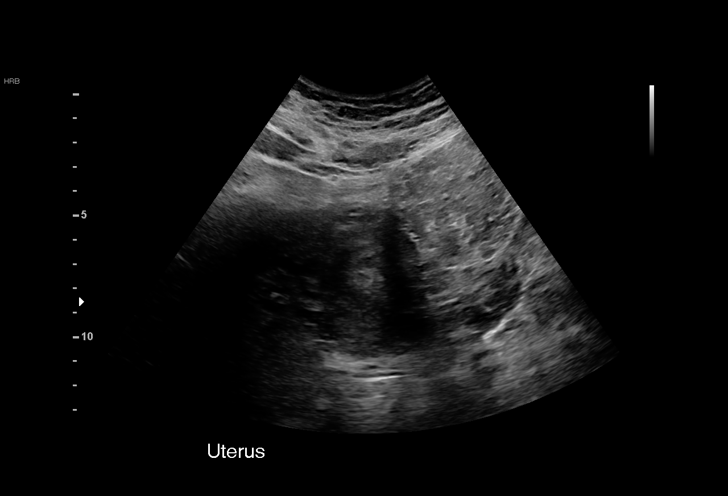
[im 13/16]
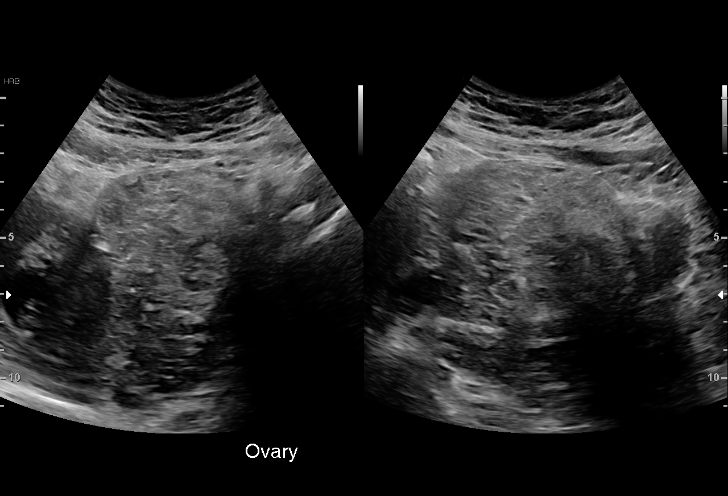
[im 14/16]
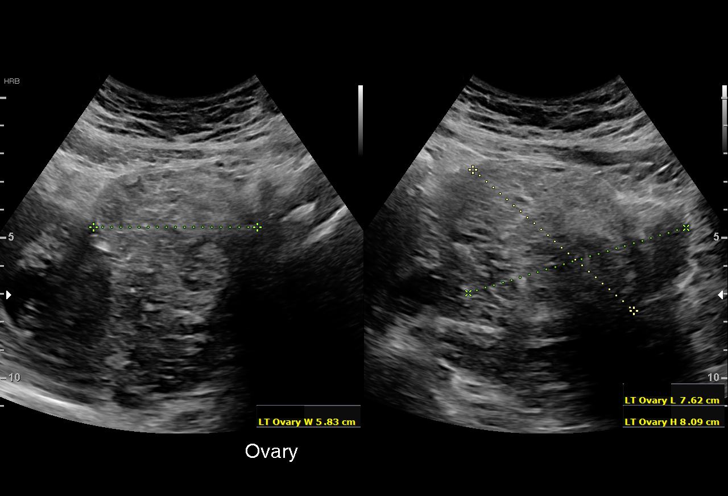
[im 15/16]
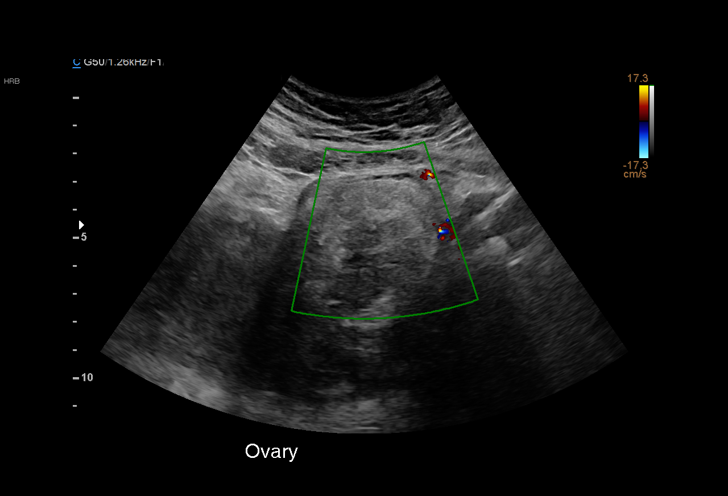
[im 16/16]
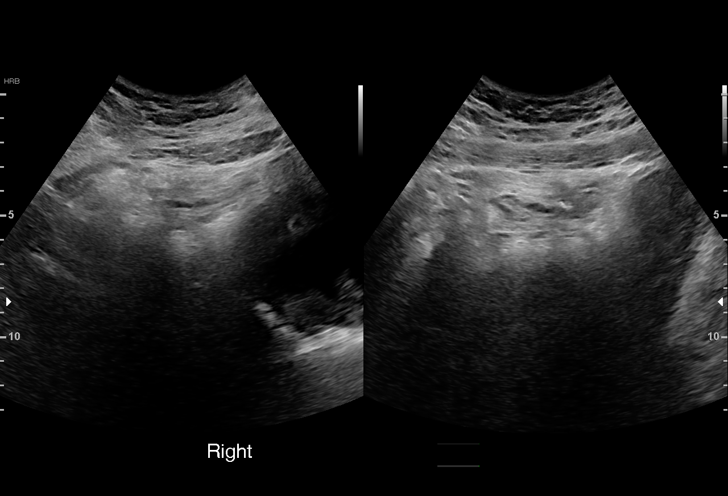

[15 of 16 positions shown; findings below may reference images not displayed]

FINDINGS: Intrauterine gestational sac: Single

Yolk sac:  None

Embryo:  Present

Cardiac Activity: Present

Heart Rate: 169 bpm

CRL:   30 mm   90 w 5 d                  US EDC: December 05, 2017

Subchorionic hemorrhage:  None visualized.

Maternal uterus/adnexae: The right ovary was not visualized. The
left ovary is enlarged measuring 7.6 x 8.1 x 5.8 cm and contains a
mixed echogenicity solid appearing component that may reflect the
previously demonstrated possible dermoid.
IMPRESSION: Viable IUP with estimated date of confinement December 05, 2017.

Enlarged left ovary containing a mixed echogenicity structure
similar to that seen on the previous study.

## 2018-08-16 IMAGING — US US MFM OB DETAIL+14 WK
1 series · 14 of 28 positions shown · non-contrast
Comparison: none

[Series 1: us mfm ob detail+14 wk · 14 of 49 slices shown]
[im 2/49]
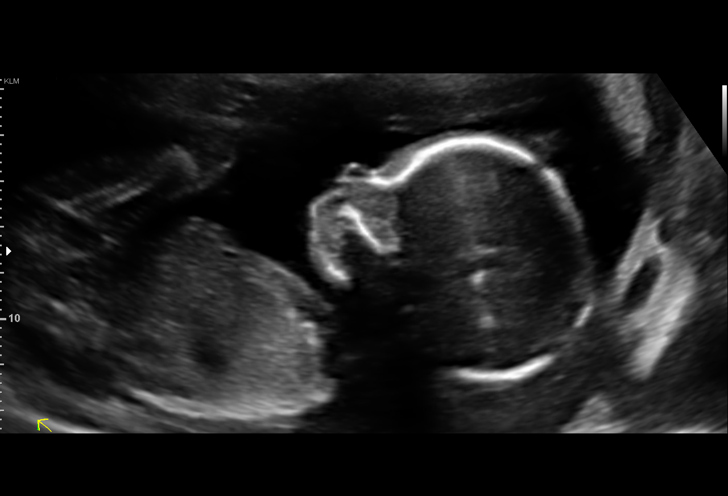
[im 6/49]
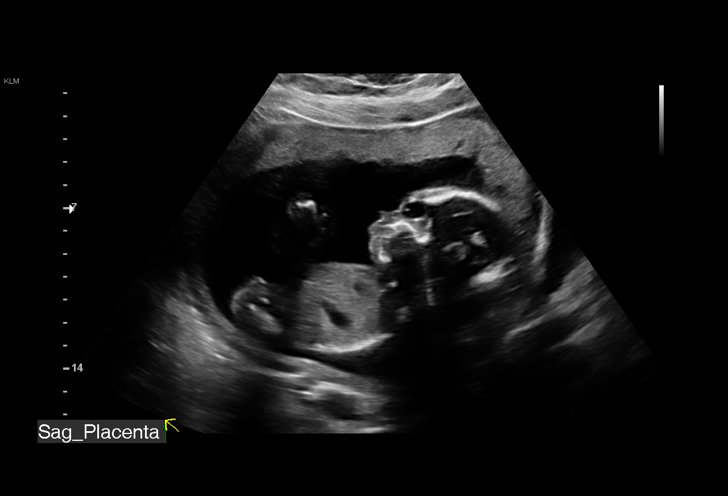
[im 9/49]
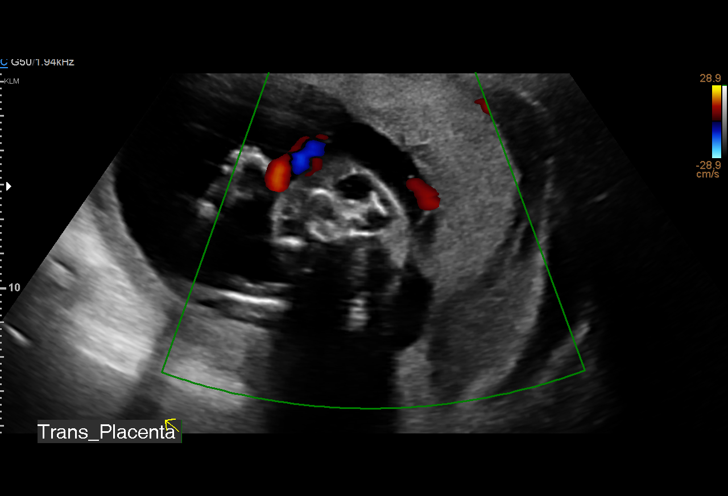
[im 13/49]
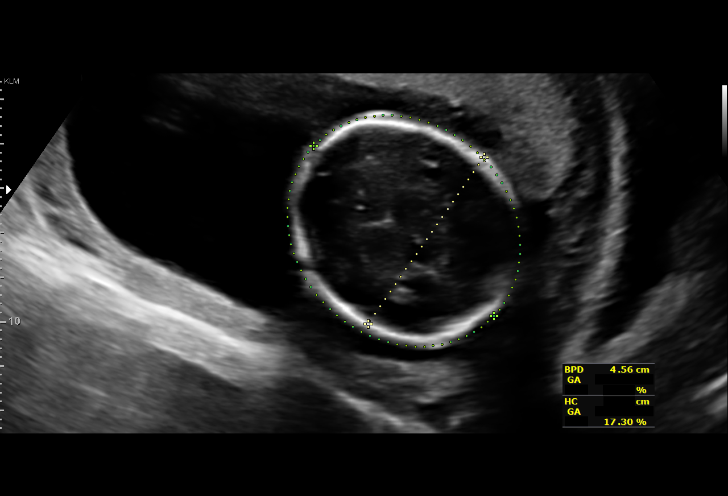
[im 17/49]
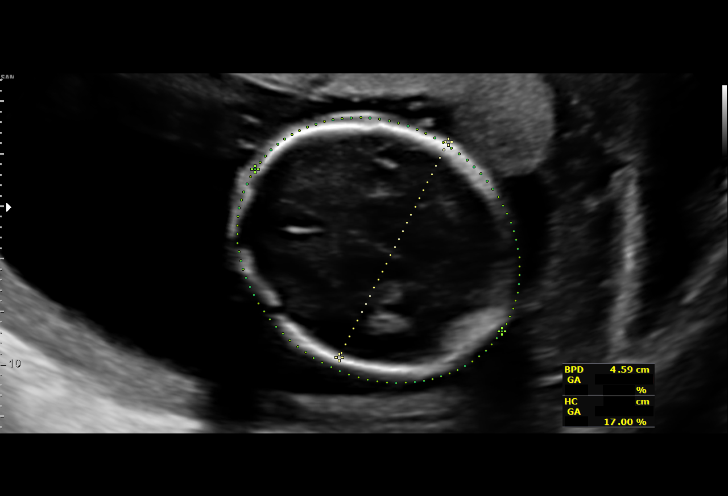
[im 20/49]
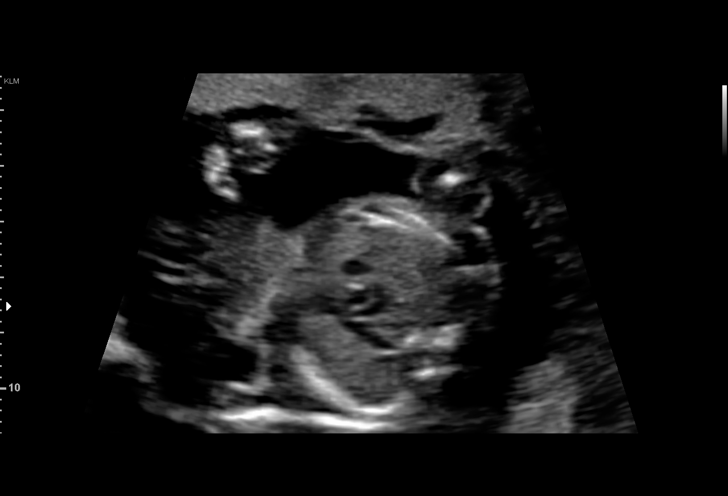
[im 24/49]
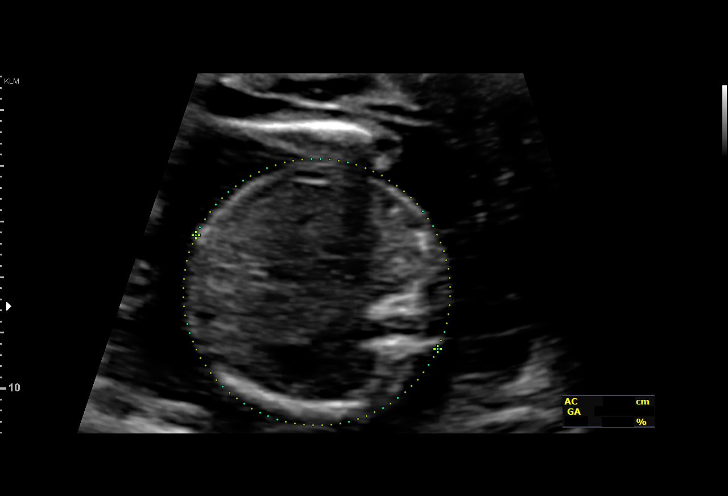
[im 27/49]
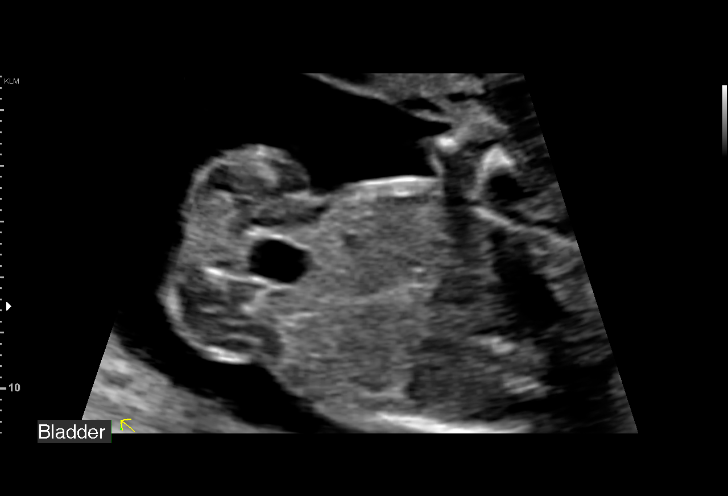
[im 31/49]
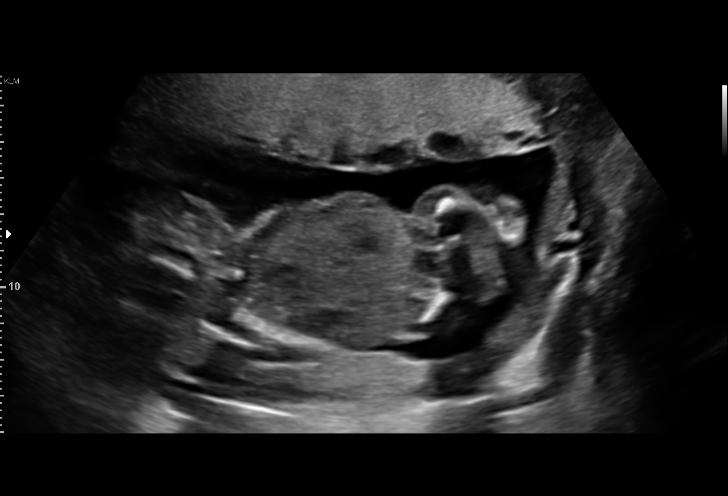
[im 34/49]
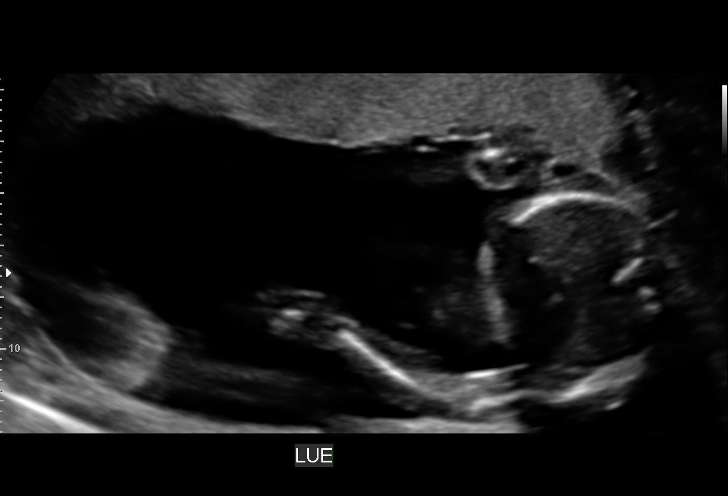
[im 38/49]
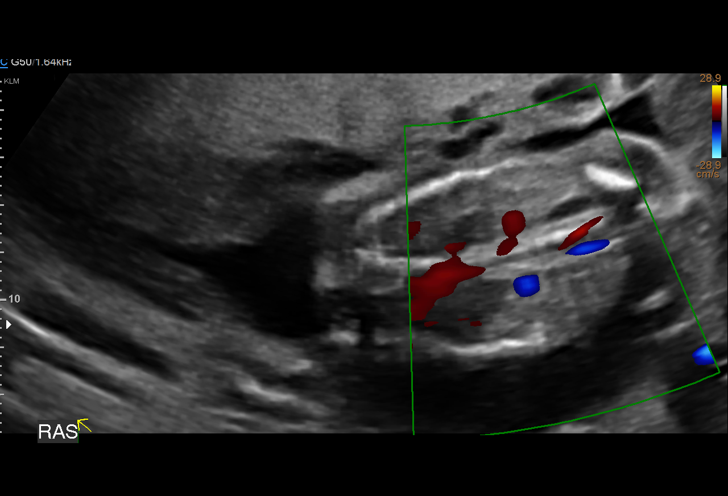
[im 41/49]
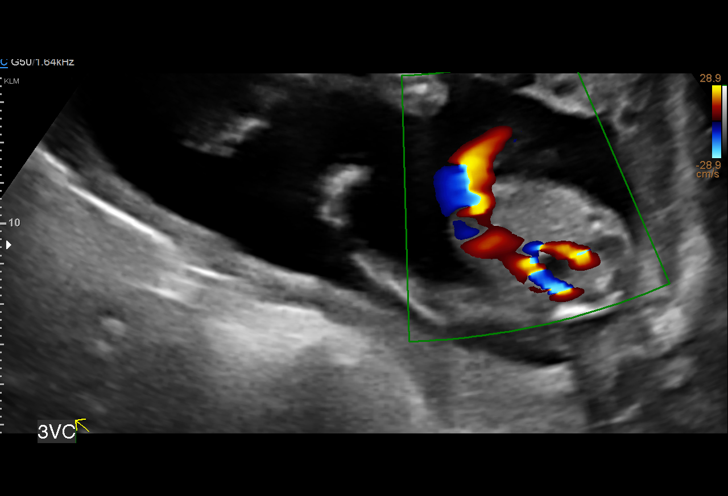
[im 45/49]
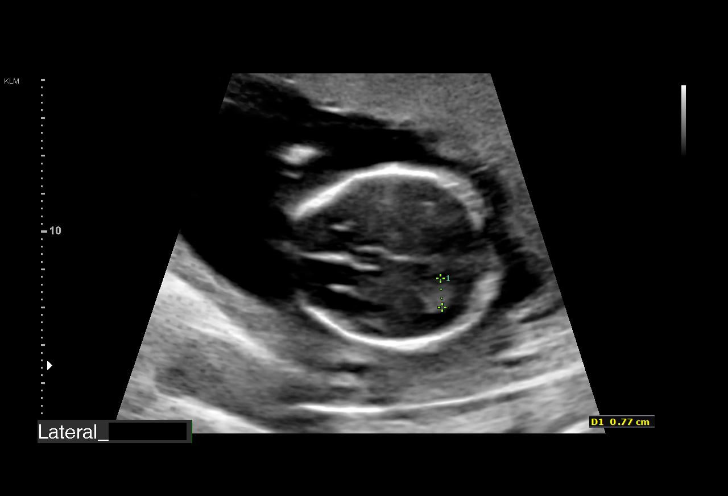
[im 49/49]
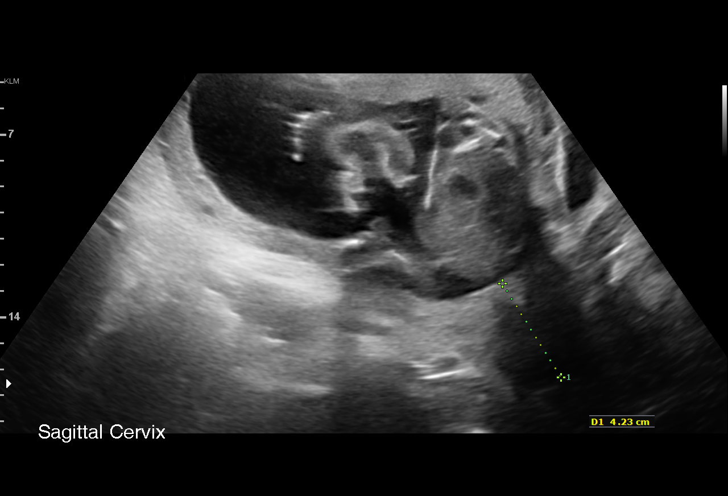

[14 of 28 positions shown; findings below may reference images not displayed]

1  NAIMA BAYS           [id number]      [id number]     [id number]
Indications

19 weeks gestation of pregnancy
Encounter for fetal anatomic survey
Obesity complicating pregnancy, second
trimester
OB History

Gravidity:    4         Term:   1        Prem:   0        SAB:   0
TOP:          2       Ectopic:  0        Living: 1
Fetal Evaluation

Num Of Fetuses:     1
Fetal Heart         148
Rate(bpm):
Cardiac Activity:   Observed
Presentation:       Cephalic
Placenta:           Anterior, above cervical os
P. Cord Insertion:  Visualized

Amniotic Fluid
AFI FV:      Subjectively within normal limits
Biometry

BPD:      45.5  mm     G. Age:  19w 5d         52  %    CI:        77.85   %    70 - 86
FL/HC:      19.0   %    16.8 -
HC:      163.2  mm     G. Age:  19w 1d         17  %    HC/AC:      1.13        1.09 -
AC:       145   mm     G. Age:  19w 6d         48  %    FL/BPD:     68.1   %
FL:         31  mm     G. Age:  19w 4d         39  %    FL/AC:      21.4   %    20 - 24
HUM:      33.9  mm     G. Age:  21w 4d       > 95  %
Est. FW:     305  gm    0 lb 11 oz      47  %
Gestational Age

LMP:           20w 2d        Date:  02/24/17                 EDD:   12/01/17
U/S Today:     19w 4d                                        EDD:   12/06/17
Best:          19w 5d     Det. By:  Early Ultrasound         EDD:   12/05/17
(05/07/17)
Anatomy

Cranium:               Appears normal         Aortic Arch:            Not well visualized
Cavum:                 Appears normal         Ductal Arch:            Not well visualized
Ventricles:            Appears normal         Diaphragm:              Appears normal
Choroid Plexus:        Appears normal         Stomach:                Appears normal, left
sided
Cerebellum:            Not well visualized    Abdomen:                Appears normal
Posterior Fossa:       Not well visualized    Abdominal Wall:         Appears nml (cord
insert, abd wall)
Nuchal Fold:           Not well visualized    Cord Vessels:           Appears normal (3
vessel cord)
Face:                  Appears normal         Kidneys:                Not well visualized
(orbits and profile)
Lips:                  Appears normal         Bladder:                Appears normal
Thoracic:              Appears normal         Spine:                  Not well visualized
Heart:                 Appears normal         Upper Extremities:      Visualized
(4CH, axis, and situs
RVOT:                  Appears normal         Lower Extremities:      Visualized
LVOT:                  Appears normal

Other:  Fetus appears to be a female. Heels visualized. Technically difficult
due to maternal habitus and fetal position.
Cervix Uterus Adnexa

Cervix
Length:            4.2  cm.
Normal appearance by transabdominal scan.
Impression

Single living intrauterine pregnancy at 19w 5d.
Placenta Anterior, above cervical os.
Appropriate fetal growth.
Normal amniotic fluid volume.
The fetal anatomic survey is not complete.
No gross fetal anomalies identified.
The cervix measures 4.2cm transabdominally without
funneling.
The adnexa appear normal bilaterally without masses.
Recommendations

Recommend follow up attempt to complete survey in 6 weeks.

## 2018-10-27 IMAGING — US US MFM OB FOLLOW-UP
1 series · 14 of 28 positions shown · non-contrast
Comparison: none

[Series 1: us mfm ob follow-up · 52 acquisitions, 14 frames shown]
[im 2/52]
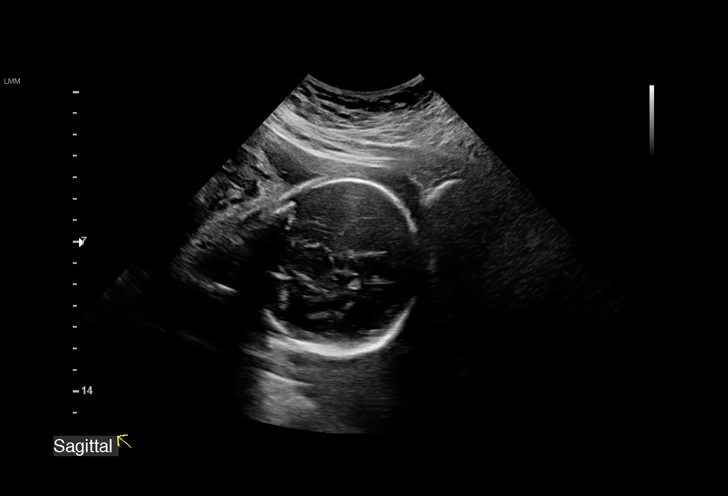
[im 6/52]
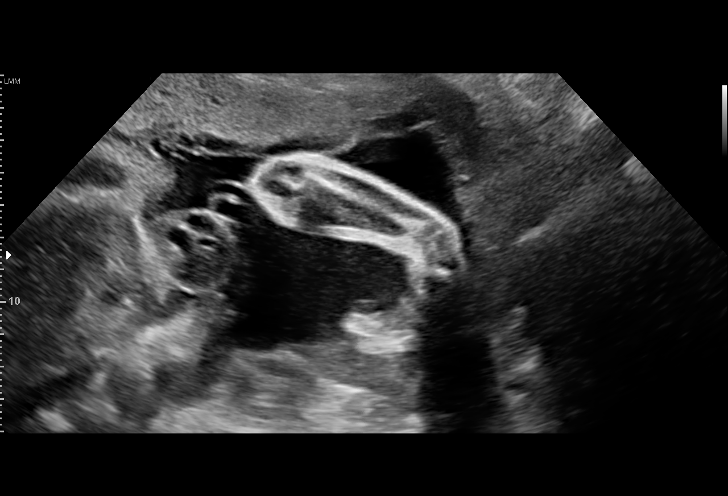
[im 10/52]
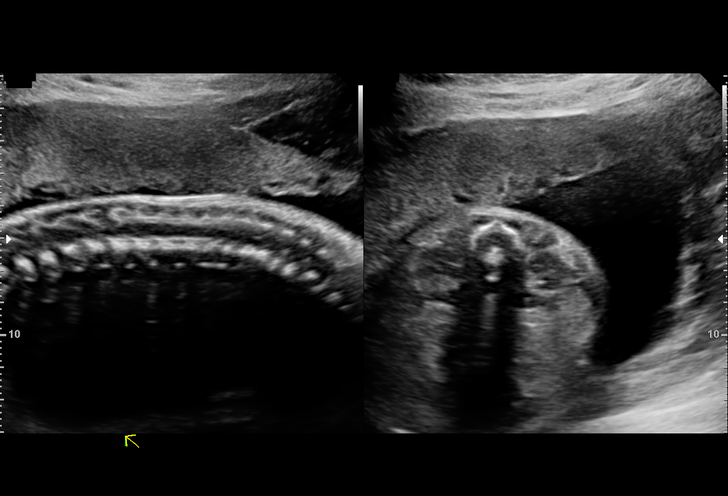
[im 14/52]
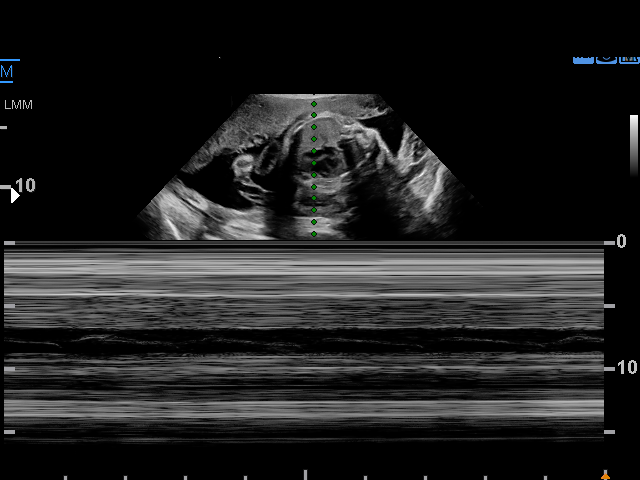
[im 18/52]
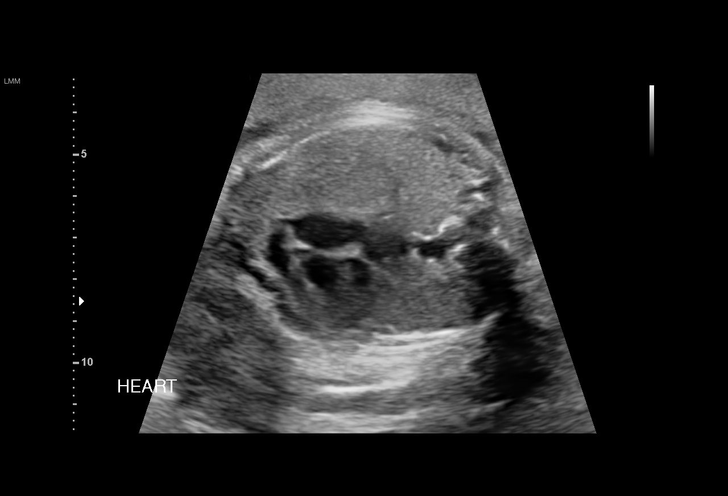
[im 21/52]
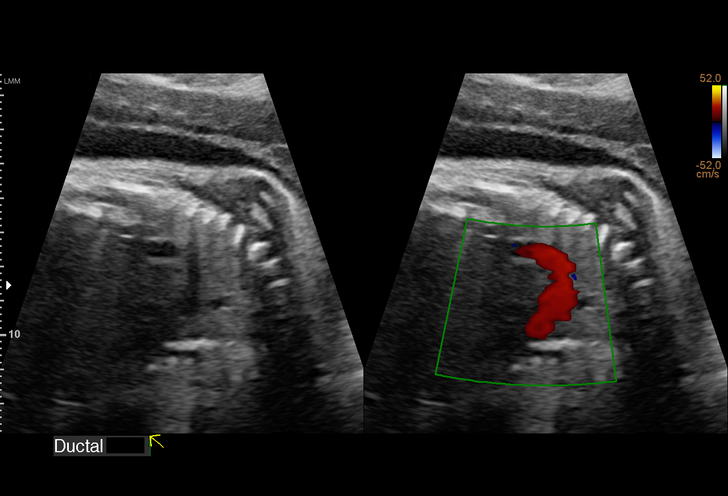
[im 25/52]
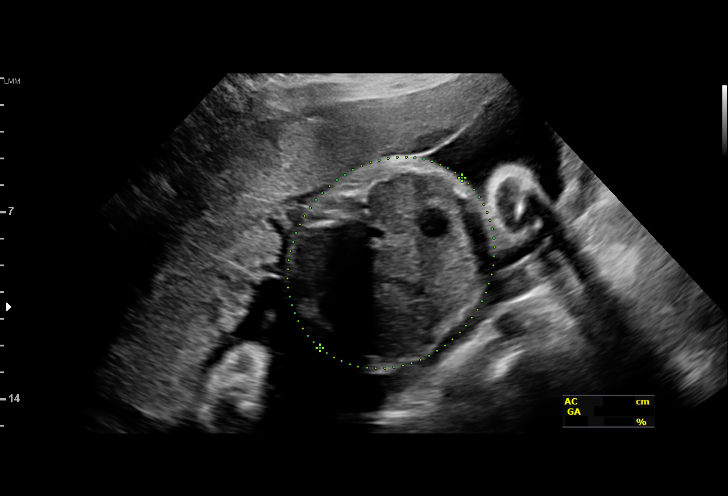
[im 29/52]
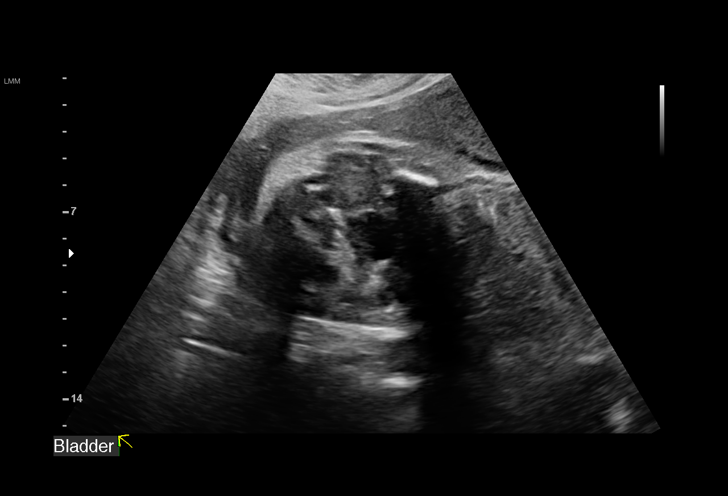
[im 33/52]
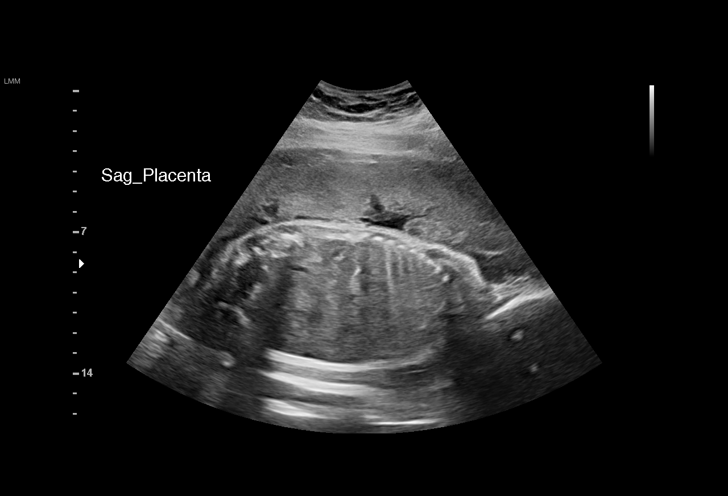
[im 36/52]
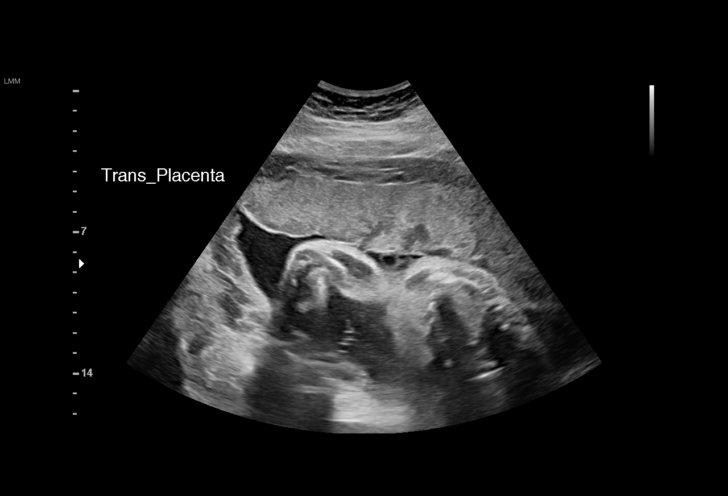
[im 40/52]
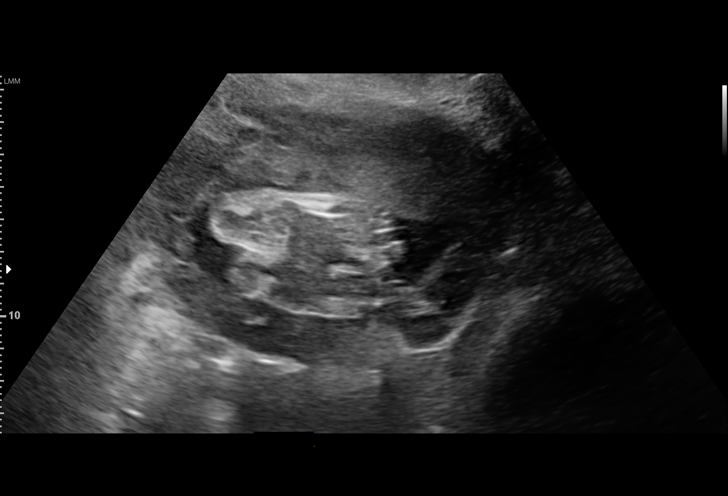
[im 44/52]
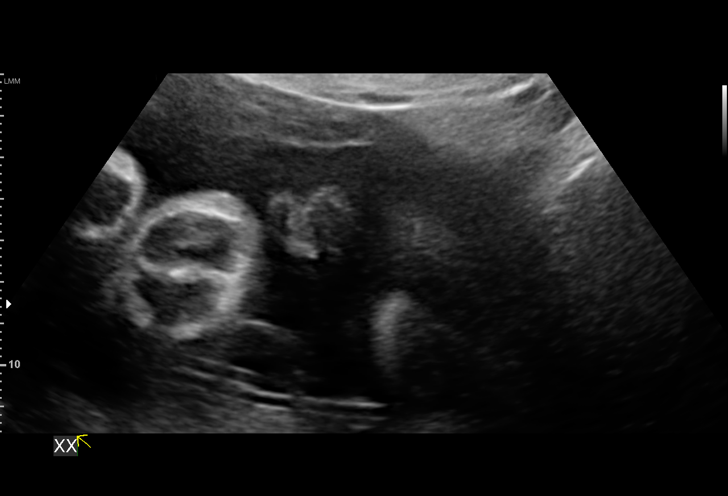
[im 48/52]
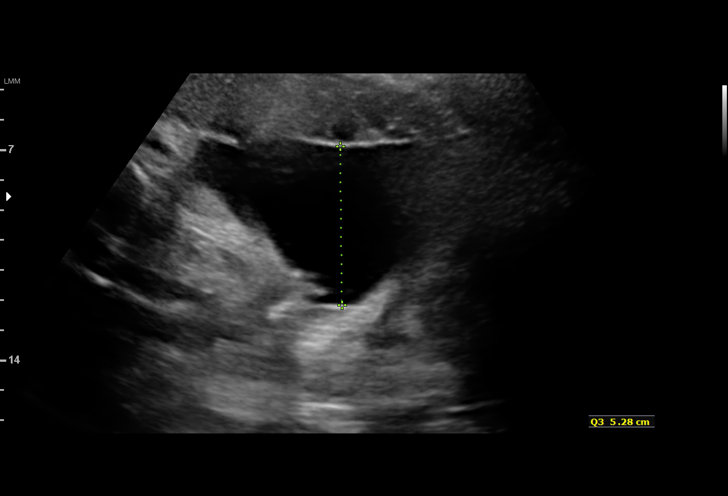
[im 52/52]
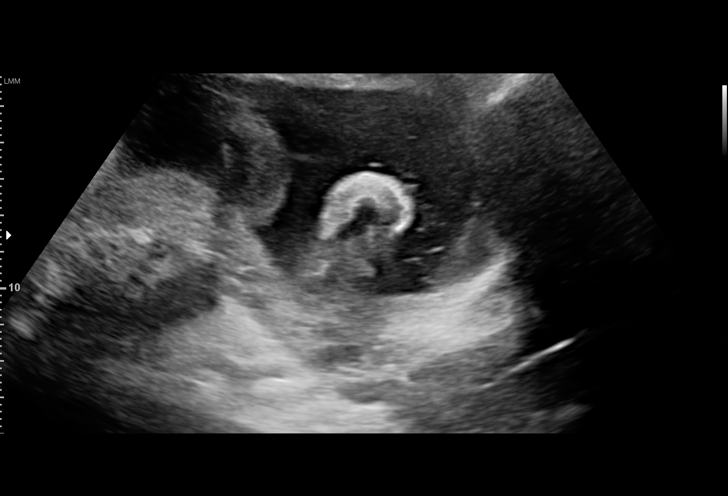

[14 of 28 positions shown; findings below may reference images not displayed]

1  AMIR HOSSEYN MYATT           [id number]      [id number]     [id number]
Indications

30 weeks gestation of pregnancy
Antenatal follow-up for nonvisualized fetal
anatomy
Obesity complicating pregnancy, third
trimester
OB History

Gravidity:    4         Term:   1        Prem:   0        SAB:   0
TOP:          2       Ectopic:  0        Living: 1
Fetal Evaluation

Num Of Fetuses:     1
Fetal Heart         151
Rate(bpm):
Cardiac Activity:   Observed
Presentation:       Cephalic
Placenta:           Anterior, above cervical os
P. Cord Insertion:  Previously Visualized

Amniotic Fluid
AFI FV:      Subjectively within normal limits

AFI Sum(cm)     %Tile       Largest Pocket(cm)
22.9            92

RUQ(cm)       RLQ(cm)       LUQ(cm)        LLQ(cm)
6.29
Biometry
BPD:      77.4  mm     G. Age:  31w 0d         71  %    CI:        81.18   %    70 - 86
FL/HC:      21.0   %    19.2 -
HC:      271.2  mm     G. Age:  29w 4d         10  %    HC/AC:      1.10        0.99 -
AC:      247.6  mm     G. Age:  29w 0d         19  %    FL/BPD:     73.6   %    71 - 87
FL:         57  mm     G. Age:  29w 6d         33  %    FL/AC:      23.0   %    20 - 24

Est. FW:    8275  gm      3 lb 1 oz     43  %
Gestational Age

LMP:           30w 4d        Date:  02/24/17                 EDD:   12/01/17
U/S Today:     29w 6d                                        EDD:   12/06/17
Best:          30w 0d     Det. By:  Early Ultrasound         EDD:   12/05/17
(05/07/17)
Anatomy

Cranium:               Appears normal         Aortic Arch:            Appears normal
Cavum:                 Appears normal         Ductal Arch:            Appears normal
Ventricles:            Appears normal         Diaphragm:              Appears normal
Choroid Plexus:        Previously seen        Stomach:                Appears normal, left
sided
Cerebellum:            Not well visualized    Abdomen:                Appears normal
Posterior Fossa:       Not well visualized    Abdominal Wall:         Previously seen
Nuchal Fold:           Not applicable (>20    Cord Vessels:           Previously seen
wks GA)
Face:                  Orbits and profile     Kidneys:                Appear normal
previously seen
Lips:                  Previously seen        Bladder:                Appears normal
Thoracic:              Appears normal         Spine:                  Appears normal
Heart:                 Previously seen        Upper Extremities:      Previously seen
RVOT:                  Previously seen        Lower Extremities:      Previously seen
LVOT:                  Previously seen

Other:  Fetus appears to be a female. Heels previously visualized.
Technically difficult due to maternal habitus and fetal position.
Impression

SIUP at 30+0 weeks
Normal interval anatomy; anatomic survey complete except
for posterior fossa
Normal amniotic fluid volume
Appropriate interval growth with EFW at the 43rd %tile
Recommendations

Follow-up as clinically indicated
